# Patient Record
Sex: Female | Born: 1970
Health system: Southern US, Community
[De-identification: ages and names within clinical notes are randomized; demographics above are authoritative.]

## PROBLEM LIST (undated history)

## (undated) ENCOUNTER — Inpatient Hospital Stay (HOSPITAL_COMMUNITY): Payer: Self-pay

## (undated) DIAGNOSIS — T7840XA Allergy, unspecified, initial encounter: Secondary | ICD-10-CM

## (undated) DIAGNOSIS — N809 Endometriosis, unspecified: Secondary | ICD-10-CM

## (undated) DIAGNOSIS — Z9889 Other specified postprocedural states: Secondary | ICD-10-CM

## (undated) DIAGNOSIS — O09529 Supervision of elderly multigravida, unspecified trimester: Secondary | ICD-10-CM

## (undated) DIAGNOSIS — R112 Nausea with vomiting, unspecified: Secondary | ICD-10-CM

## (undated) DIAGNOSIS — C801 Malignant (primary) neoplasm, unspecified: Secondary | ICD-10-CM

## (undated) HISTORY — DX: Allergy, unspecified, initial encounter: T78.40XA

## (undated) HISTORY — PX: APPENDECTOMY: SHX54

## (undated) HISTORY — DX: Malignant (primary) neoplasm, unspecified: C80.1

## (undated) HISTORY — PX: TONSILLECTOMY: SUR1361

## (undated) HISTORY — PX: BREAST REDUCTION SURGERY: SHX8

## (undated) HISTORY — PX: REDUCTION MAMMAPLASTY: SUR839

## (undated) HISTORY — PX: LAPAROSCOPY: SHX197

---

## 2004-08-21 ENCOUNTER — Ambulatory Visit (HOSPITAL_COMMUNITY): Admission: RE | Admit: 2004-08-21 | Discharge: 2004-08-21 | Payer: Self-pay | Admitting: Obstetrics & Gynecology

## 2004-09-14 ENCOUNTER — Ambulatory Visit (HOSPITAL_COMMUNITY): Admission: RE | Admit: 2004-09-14 | Discharge: 2004-09-14 | Payer: Self-pay | Admitting: Obstetrics & Gynecology

## 2004-09-25 ENCOUNTER — Inpatient Hospital Stay (HOSPITAL_COMMUNITY): Admission: AD | Admit: 2004-09-25 | Discharge: 2004-09-25 | Payer: Self-pay | Admitting: Obstetrics

## 2004-12-09 HISTORY — PX: EXCISION MORTON'S NEUROMA: SHX5013

## 2005-05-13 ENCOUNTER — Inpatient Hospital Stay (HOSPITAL_COMMUNITY): Admission: AD | Admit: 2005-05-13 | Discharge: 2005-05-15 | Payer: Self-pay | Admitting: Obstetrics & Gynecology

## 2005-05-16 ENCOUNTER — Encounter: Admission: RE | Admit: 2005-05-16 | Discharge: 2005-06-06 | Payer: Self-pay | Admitting: Obstetrics & Gynecology

## 2005-09-04 ENCOUNTER — Ambulatory Visit: Payer: Self-pay | Admitting: Family Medicine

## 2005-09-10 ENCOUNTER — Ambulatory Visit: Payer: Self-pay | Admitting: Family Medicine

## 2006-08-05 ENCOUNTER — Ambulatory Visit: Payer: Self-pay | Admitting: Family Medicine

## 2007-01-01 ENCOUNTER — Encounter: Admission: RE | Admit: 2007-01-01 | Discharge: 2007-01-01 | Payer: Self-pay | Admitting: Obstetrics & Gynecology

## 2007-07-01 ENCOUNTER — Ambulatory Visit: Payer: Self-pay | Admitting: Family Medicine

## 2007-07-02 LAB — CONVERTED CEMR LAB
HCV Ab: NEGATIVE
Hep A IgM: NEGATIVE
Hep B C IgM: NEGATIVE
Hepatitis B Surface Ag: NEGATIVE

## 2007-07-03 ENCOUNTER — Encounter: Payer: Self-pay | Admitting: Family Medicine

## 2008-01-25 ENCOUNTER — Telehealth: Payer: Self-pay | Admitting: Family Medicine

## 2008-01-26 ENCOUNTER — Ambulatory Visit: Payer: Self-pay | Admitting: Family Medicine

## 2008-01-29 LAB — CONVERTED CEMR LAB: Vit D, 1,25-Dihydroxy: 15 — ABNORMAL LOW (ref 30–89)

## 2008-03-04 ENCOUNTER — Ambulatory Visit (HOSPITAL_COMMUNITY): Admission: RE | Admit: 2008-03-04 | Discharge: 2008-03-04 | Payer: Self-pay | Admitting: Obstetrics & Gynecology

## 2008-05-25 ENCOUNTER — Ambulatory Visit (HOSPITAL_COMMUNITY): Admission: RE | Admit: 2008-05-25 | Discharge: 2008-05-25 | Payer: Self-pay | Admitting: Obstetrics & Gynecology

## 2008-06-28 ENCOUNTER — Ambulatory Visit (HOSPITAL_COMMUNITY): Admission: RE | Admit: 2008-06-28 | Discharge: 2008-06-28 | Payer: Self-pay | Admitting: Obstetrics & Gynecology

## 2008-10-14 ENCOUNTER — Inpatient Hospital Stay (HOSPITAL_COMMUNITY): Admission: RE | Admit: 2008-10-14 | Discharge: 2008-10-17 | Payer: Self-pay | Admitting: Obstetrics & Gynecology

## 2010-01-30 ENCOUNTER — Ambulatory Visit: Payer: Self-pay | Admitting: Family Medicine

## 2010-01-30 DIAGNOSIS — E7800 Pure hypercholesterolemia, unspecified: Secondary | ICD-10-CM

## 2010-01-30 DIAGNOSIS — E559 Vitamin D deficiency, unspecified: Secondary | ICD-10-CM

## 2010-01-31 LAB — CONVERTED CEMR LAB
ALT: 12 units/L (ref 0–35)
AST: 20 units/L (ref 0–37)
Albumin: 4 g/dL (ref 3.5–5.2)
Alkaline Phosphatase: 53 units/L (ref 39–117)
BUN: 13 mg/dL (ref 6–23)
Bilirubin, Direct: 0.1 mg/dL (ref 0.0–0.3)
CO2: 28 meq/L (ref 19–32)
Calcium: 9.3 mg/dL (ref 8.4–10.5)
Chloride: 106 meq/L (ref 96–112)
Cholesterol: 155 mg/dL (ref 0–200)
Creatinine, Ser: 0.6 mg/dL (ref 0.4–1.2)
GFR calc non Af Amer: 118.09 mL/min (ref >60)
Glucose, Bld: 88 mg/dL (ref 70–99)
HDL: 50.2 mg/dL (ref >39.00)
LDL Cholesterol: 88 mg/dL (ref 0–99)
Potassium: 4.2 meq/L (ref 3.5–5.1)
Sodium: 139 meq/L (ref 135–145)
TSH: 1.13 microintl units/mL (ref 0.35–5.50)
Total Bilirubin: 0.5 mg/dL (ref 0.3–1.2)
Total CHOL/HDL Ratio: 3
Total Protein: 7.6 g/dL (ref 6.0–8.3)
Triglycerides: 84 mg/dL (ref 0.0–149.0)
VLDL: 16.8 mg/dL (ref 0.0–40.0)
Vit D, 25-Hydroxy: 29 ng/mL — ABNORMAL LOW (ref 30–89)

## 2010-04-06 ENCOUNTER — Ambulatory Visit: Payer: Self-pay | Admitting: Internal Medicine

## 2010-04-06 DIAGNOSIS — H6980 Other specified disorders of Eustachian tube, unspecified ear: Secondary | ICD-10-CM | POA: Insufficient documentation

## 2010-04-17 ENCOUNTER — Ambulatory Visit (HOSPITAL_COMMUNITY): Admission: RE | Admit: 2010-04-17 | Discharge: 2010-04-17 | Payer: Self-pay | Admitting: Obstetrics & Gynecology

## 2010-05-21 ENCOUNTER — Emergency Department (HOSPITAL_COMMUNITY): Admission: EM | Admit: 2010-05-21 | Discharge: 2010-05-22 | Payer: Self-pay | Admitting: Emergency Medicine

## 2010-07-17 ENCOUNTER — Encounter (INDEPENDENT_AMBULATORY_CARE_PROVIDER_SITE_OTHER): Payer: Self-pay | Admitting: *Deleted

## 2010-07-30 ENCOUNTER — Encounter: Payer: Self-pay | Admitting: Family Medicine

## 2010-07-31 ENCOUNTER — Telehealth: Payer: Self-pay | Admitting: Family Medicine

## 2010-08-20 ENCOUNTER — Ambulatory Visit (HOSPITAL_COMMUNITY): Admission: RE | Admit: 2010-08-20 | Discharge: 2010-08-20 | Payer: Self-pay | Admitting: Obstetrics & Gynecology

## 2011-01-09 NOTE — Assessment & Plan Note (Signed)
Summary: EAR IS STOPPED UP   Vital Signs:  Patient profile:   40 year old female Height:      65.5 inches Weight:      174.25 pounds BMI:     28.66 Temp:     98.1 degrees F oral Pulse rate:   64 / minute Pulse rhythm:   regular BP sitting:   112 / 62  (right arm) Cuff size:   regular  Vitals Entered By: Linde Gillis CMA Duncan Dull) (April 06, 2010 3:03 PM) CC: ears stopped up   History of Present Illness: Left ear "is driving me crazy" Feels full--like it is occluded Using debrox and warm water flushes for 3 days No success  No fever No URI symptoms like sneezing, coughing or rhinorrhea  some allergy problems with chronic congestion no meds though -- "its just the way I am"  Preventive Screening-Counseling & Management  Alcohol-Tobacco     Smoking Status: never  Allergies (verified): No Known Drug Allergies  Past History:  Past Surgical History: Last updated: 06/25/2007 NSVD 05/2005 Appendectomy 1987 Breast Reduction 1989 Laparotomy Endometriosis Tonsillectomy 1991 MRI C-Spine, sm Thyroid nodules, C5/6 mod disc dz 07/03/05  Family History: Last updated: 01/30/2010 Father: A 70   PTCA  Chol Mother: A 31 Brother A 41 CV- + Father HBP- + PGM Prostate Cancer- PGF ? Colon Cancer- + MGM + Stroke PGM  Social History: Last updated: 04/06/2010 Marital Status: Married  Husb had vasectomy and reversal and redo. 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   Head:  no sinus tenderness Ears:  no tragal tenderness TMs appear normal bilat no canal inflammation No  clear fluid Neck:  supple, no masses, and no cervical lymphadenopathy.     Impression & Recommendations:  Problem # 1:  EUSTACHIAN TUBE DYSFUNCTION (ICD-381.81) Assessment New doesn't clear ly seem to have serous OM though not clearly allergic mediated  will try OTC antihistamine and decongestant ENT referral if not improving  Complete Medication List: 1)  Vitamin D 1000 Unit Tabs (Cholecalciferol) .... Take one by mouth daily 2)  Multivitamins Caps (Multiple vitamin) .... Take one by mouth daily 3)  Dexatrim Natural Tabs (Nutritional supp - diet aids) .... As needed  Patient Instructions: 1)  Try cetirizine or loratadine with pseudoephedrine for the next week. 2)  Call if no better in a week or so and we will refer you for ENT evaluation  Current Allergies (reviewed today): No known allergies

## 2011-01-09 NOTE — Letter (Signed)
Summary: Nadara Eaton letter  [place] at [facility]  [address] [place], [place] [address]   Phone: [phone]  Fax: [phone]       07/17/2010 MRN: [id number]  Alynna CATER [address] [place], [place]  [address]  Dear Ms. [facility] [place], and [place] announce the retirement of Arta Silence, M.D., from full-time practice at the [facility] office effective June 07, 2010 and his plans of returning part-time.  It is important to Dr. Hetty Ely and to our practice that you understand that [facility] has seven physicians in our office for your health care needs.  We will continue to offer the same exceptional care that you have today.    Dr. Hetty Ely has spoken to many of you about his plans for retirement and returning part-time in the fall.   We will continue to work with you through the transition to schedule appointments for you in the office and meet the high standards that [facility] is committed to.   Again, it is with great pleasure that we share the news that Dr. Hetty Ely will return to [facility] in October of 2011 with a reduced schedule.    If you have any questions, or would like to request an appointment with one of our physicians, please call us at [phone] and press the option for Scheduling an appointment.  We take pleasure in providing you with excellent patient care and look forward to seeing you at your next office visit.  Our [facility] Physicians are:  Tillman Abide, M.D. Laurita Quint, M.D. Roxy Manns, M.D. Kerby Nora, M.D. Hannah Beat, M.D. Ruthe Mannan, M.D. We proudly welcomed Raechel Ache, M.D. and Eustaquio Boyden, M.D. to the practice in July/August 2011.  Sincerely,  [facility]

## 2011-01-09 NOTE — Progress Notes (Signed)
Summary: paper work for [place] Note Call from Patient Call back at [employer] [phone]   Caller: Patient Call For: Shaune Leeks MD Summary of Call: Patient came in Feb. to have paper work filled out for adoption. Patient is calling today stating that on her form the question that asks if the patient has the emotional and physical stability to parent a child is checked no and she feels that it was done in error. It was also checked no on her husband's form which was not actually scanned into the patient's chart so the wife is going to bring the original copy in. She wants to know if this can be fixed or if they will need to do the visit over again w/ new form. Please advise. Forms are on your desk.   Initial call taken by: Melody Comas,  July 31, 2010 3:18 PM  Follow-up for Phone Call        I agree, this appears to be done in error.  I corrected both forms and marked it with my initals and the date.  Please send this back in.  If they need the forms done again from scratch, please let me know.  Follow-up by: Crawford Givens MD,  July 31, 2010 4:56 PM  Additional Follow-up for Phone Call Additional follow up Details #1::        Spoke w/ patient and she says that she thinks it would be better if you signed your whole name instead of just initials. Forms are on your desk.  Additional Follow-up by: Melody Comas,  August 01, 2010 9:13 AM    Additional Follow-up for Phone Call Additional follow up Details #2::    done,  thanks.  In my out box.  Follow-up by: Crawford Givens MD,  August 01, 2010 1:33 PM  Additional Follow-up for Phone Call Additional follow up Details #3:: Details for Additional Follow-up Action Taken: Called patient, [facility] for her to return my call. Adoption paper work left up front for pick up.  Melody Comas  August 01, 2010 4:22 PM  Spoke with patient, she will come by and pick up letters.  Additional Follow-up by: Melody Comas,  August 02, 2010 9:06 AM

## 2011-01-09 NOTE — Assessment & Plan Note (Signed)
Summary: discuss paper work/ alc   Vital Signs:  Patient profile:   40 year old female Height:      65.5 inches Weight:      177 pounds BMI:     29.11 Temp:     98.5 degrees F oral Pulse rate:   60 / minute Pulse rhythm:   regular BP sitting:   100 / 60  (left arm) Cuff size:   regular  Vitals Entered By: Sydell Axon LPN (January 30, 2010 3:34 PM) CC: Complete paperwork for adoption   History of Present Illness: Pt here for paperwork for adoption. She has had baby in the meantime, has 29 month old daughter. They had done this 3 yrs ago.  She has no complaints. She weighs more than she would like.  She has been on Vit D 1000Iu daily. HAd been on 50000Iu weekly a few years ago and was lost to followup.  Preventive Screening-Counseling & Management  Alcohol-Tobacco     Alcohol drinks/day: 0     Smoking Status: never     Passive Smoke Exposure: no  Caffeine-Diet-Exercise     Caffeine use/day: 1 soda     Does Patient Exercise: no  Problems Prior to Update: 1)  Vitamin D Deficiency  (ICD-268.9) 2)  Familial Hypercholesterolemia  (ICD-272.0) 3)  Health Maintenance Exam  (ICD-V70.0)  Medications Prior to Update: 1)  Vitamin D 16109 Unit  Caps (Ergocalciferol) .... One Tab By Mouth Once Every Other Week For Eight Weeks (Four Doses)  Allergies: No Known Drug Allergies  Past History:  Past Surgical History: Last updated: 06/25/2007 NSVD 05/2005 Appendectomy 1987 Breast Reduction 1989 Laparotomy Endometriosis Tonsillectomy 1991 MRI C-Spine, sm Thyroid nodules, C5/6 mod disc dz 07/03/05  Family History: Last updated: 01/30/2010 Father: A 70   PTCA  Chol Mother: A 19 Brother A 41 CV- + Father HBP- + PGM Prostate Cancer- PGF ? Colon Cancer- + MGM + Stroke PGM  Social History: Last updated: 01/30/2010 Marital Status: Married  Husb had vasectomy and reversal and redo. Children: One son  58/2, one daughter 43mo Occupation: Housewife  Risk Factors: Alcohol  Use: 0 (01/30/2010) Caffeine Use: 1 soda (01/30/2010) Exercise: no (01/30/2010)  Risk Factors: Smoking Status: never (01/30/2010) Passive Smoke Exposure: no (01/30/2010)  Family History: Father: A 70   PTCA  Chol Mother: A 44 Brother A 41 CV- + Father HBP- + PGM Prostate Cancer- PGF ? Colon Cancer- + MGM + Stroke PGM  Social History: Marital Status: Married  Husb had vasectomy and reversal and redo. Children: One son  45/2, one daughter 43mo Occupation: Housewife Caffeine use/day:  1 soda  Review of Systems General:  Denies chills, fatigue, fever, loss of appetite, malaise, sleep disorder, sweats, weakness, and weight loss. Eyes:  Denies blurring, discharge, and eye pain; periodic eye problems w/ infections, etc last 5 years.. ENT:  Denies decreased hearing, ear discharge, earache, and ringing in ears. CV:  Denies chest pain or discomfort, fainting, fatigue, palpitations, shortness of breath with exertion, and swelling of feet. Resp:  Denies cough, shortness of breath, and wheezing. GI:  Denies abdominal pain, bloody stools, change in bowel habits, constipation, dark tarry stools, diarrhea, indigestion, loss of appetite, nausea, vomiting, vomiting blood, and yellowish skin color. GU:  Denies discharge, dysuria, nocturia, and urinary frequency. MS:  Complains of mid back pain and stiffness; denies joint pain, low back pain, muscle aches, cramps, and muscle weakness; global. Derm:  Denies changes in color of skin, changes in nail beds,  dryness, excessive perspiration, flushing, hair loss, insect bite(s), itching, lesion(s), poor wound healing, and rash. Neuro:  Denies memory loss, numbness, poor balance, tingling, and tremors.  Physical Exam  General:  Well-developed,well-nourished,in no acute distress; alert,appropriate and cooperative throughout examination Head:  Normocephalic and atraumatic without obvious abnormalities. No apparent alopecia or balding. Sinuses NT. Eyes:   Conjunctiva clear bilaterally.  Ears:  External ear exam shows no significant lesions or deformities.  Otoscopic examination reveals clear canals, tympanic membranes are intact bilaterally without bulging, retraction, inflammation or discharge. Hearing is grossly normal bilaterally. Nose:  External nasal examination shows no deformity or inflammation. Nasal mucosa are pink and moist without lesions or exudates. Mouth:  Oral mucosa and oropharynx without lesions or exudates.  Teeth in good repair. Neck:  No deformities, masses, or tenderness noted. Chest Wall:  No deformities, masses, or tenderness noted. Breasts:  not done Lungs:  Normal respiratory effort, chest expands symmetrically. Lungs are clear to auscultation, no crackles or wheezes. Heart:  Normal rate and regular rhythm. S1 and S2 normal without gallop, murmur, click, rub or other extra sounds. Abdomen:  Bowel sounds positive,abdomen soft and non-tender without masses, organomegaly or hernias noted. Rectal:  not done Genitalia:  not done Msk:  No deformity or scoliosis noted of thoracic or lumbar spine.   Pulses:  R and L carotid,radial,femoral,dorsalis pedis and posterior tibial pulses are full and equal bilaterally Extremities:  No clubbing, cyanosis, edema, or deformity noted with normal full range of motion of all joints.   Neurologic:  No cranial nerve deficits noted. Station and gait are normal. Sensory, motor and coordinative functions appear intact. Skin:  Intact without suspicious lesions or rashes Cervical Nodes:  No lymphadenopathy noted Inguinal Nodes:  No significant adenopathy Psych:  Cognition and judgment appear intact. Alert and cooperative with normal attention span and concentration. No apparent delusions, illusions, hallucinations   Impression & Recommendations:  Problem # 1:  HEALTH MAINTENANCE EXAM (ICD-V70.0) PPd placed today. Orders: TLB-BMP (Basic Metabolic Panel-BMET) (80048-METABOL)  Problem # 2:   FAMILIAL HYPERCHOLESTEROLEMIA (ICD-272.0) Assessment: Unchanged Will recheck today. Orders: TLB-Lipid Panel (80061-LIPID) TLB-Hepatic/Liver Function Pnl (80076-HEPATIC) TLB-TSH (Thyroid Stimulating Hormone) (84443-TSH)  Problem # 3:  VITAMIN D DEFICIENCY (ICD-268.9) Assessment: Unchanged Will recheck today. Is basically on 1000Iu daily. Orders: T-Vitamin D (25-Hydroxy) [phone]) Specimen Handling (91478)  Complete Medication List: 1)  Vitamin D 1000 Unit Tabs (Cholecalciferol) .... Take one by mouth daily 2)  Multivitamins Caps (Multiple vitamin) .... Take one by mouth daily 3)  Dexatrim Natural Tabs (Nutritional supp - diet aids) .... As needed  Other Orders: TB Skin Test [phone]) Admin 1st Vaccine (13086) Admin 1st Vaccine [facility]) [phone])  Patient Instructions: 1)  Form filled out. PPD today, RTC Thu. Report labs then when PPD read.  Current Allergies (reviewed today): No known allergies    PPD Application    Vaccine Type: PPD    Site: left forearm    Mfr: Sanofi Pasteur    Dose: 0.1 ml    Route: ID    Given by: Sydell Axon LPN    Exp. Date: 05/05/2012    Lot #: [id number]   Appended Document: discuss paper work/ alc TB skin test read today, negative.  Form dated and signed, original given to patient and a copy was sent to be scanned into chart.

## 2011-01-09 NOTE — Letter (Signed)
Summary: Corrected [facility],Request for Medical Informat  [facility]rected [facility] ,Request for Medical Information   Imported By: Beau Fanny 08/01/2010 14:20:43  _____________________________________________________________________  External Attachment:    Type:   Image     Comment:   External Document

## 2011-01-09 NOTE — Letter (Signed)
Summary: [facility] [place] Request for Medical Information Form  [facility] Request for Medical Information Form   Imported By: Beau Fanny 02/01/2010 13:43:12  _____________________________________________________________________  External Attachment:    Type:   Image     Comment:   External Document

## 2011-02-25 LAB — DIFFERENTIAL
Basophils Absolute: 0.2 10*3/uL — ABNORMAL HIGH (ref 0.0–0.1)
Basophils Relative: 2 % — ABNORMAL HIGH (ref 0–1)
Eosinophils Absolute: 0.1 10*3/uL (ref 0.0–0.7)
Eosinophils Relative: 1 % (ref 0–5)
Lymphocytes Relative: 31 % (ref 12–46)
Lymphs Abs: 3.5 10*3/uL (ref 0.7–4.0)
Monocytes Absolute: 1 10*3/uL (ref 0.1–1.0)
Monocytes Relative: 9 % (ref 3–12)
Neutro Abs: 6.4 10*3/uL (ref 1.7–7.7)
Neutrophils Relative %: 57 % (ref 43–77)

## 2011-02-25 LAB — CBC
HCT: 39.6 % (ref 36.0–46.0)
Hemoglobin: 13.4 g/dL (ref 12.0–15.0)
MCHC: 33.9 g/dL (ref 30.0–36.0)
MCV: 90.3 fL (ref 78.0–100.0)
RBC: 4.39 MIL/uL (ref 3.87–5.11)
RDW: 13.9 % (ref 11.5–15.5)
WBC: 11.2 10*3/uL — ABNORMAL HIGH (ref 4.0–10.5)

## 2011-02-25 LAB — POCT CARDIAC MARKERS
CKMB, poc: <1 ng/mL — ABNORMAL LOW (ref 1.0–8.0)
CKMB, poc: <1 ng/mL — ABNORMAL LOW (ref 1.0–8.0)
Myoglobin, poc: 34.6 ng/mL (ref 12–200)
Myoglobin, poc: 49.3 ng/mL (ref 12–200)
Troponin i, poc: <0.05 ng/mL (ref 0.00–0.09)
Troponin i, poc: <0.05 ng/mL (ref 0.00–0.09)

## 2011-02-25 LAB — POCT I-STAT, CHEM 8
BUN: 14 mg/dL (ref 6–23)
Calcium, Ion: 1.13 mmol/L (ref 1.12–1.32)
Chloride: 107 mEq/L (ref 96–112)
Creatinine, Ser: 0.6 mg/dL (ref 0.4–1.2)
Glucose, Bld: 88 mg/dL (ref 70–99)
HCT: 41 % (ref 36.0–46.0)
Hemoglobin: 13.9 g/dL (ref 12.0–15.0)
Potassium: 3.7 mEq/L (ref 3.5–5.1)
Sodium: 141 mEq/L (ref 135–145)
TCO2: 25 mmol/L (ref 0–100)

## 2011-02-25 LAB — D-DIMER, QUANTITATIVE: D-Dimer, Quant: 0.51 ug/mL-FEU — ABNORMAL HIGH (ref 0.00–0.48)

## 2011-04-23 NOTE — H&P (Signed)
Patricia Thornton, Patricia Thornton                  ACCOUNT NO.:  [id number]   MEDICAL RECORD NO.:  [id number]          PATIENT TYPE:  INP   LOCATION:  9171                          [facility]ILITY:  [facility]   PHYSICIAN:  Roseanna Rainbow, M.D.DATE OF BIRTH:  [birth year]   DATE OF ADMISSION:  10/14/2008  DATE OF DISCHARGE:                              HISTORY & PHYSICAL   CHIEF COMPLAINT:  The patient is a 40 year old para 1 with an estimated  date of confinement of October 23, 2008, with an intrauterine pregnancy  at 39 weeks complaining of rupture of membranes.   HISTORY OF PRESENT ILLNESS:  The patient reports several gushes of clear  fluid several hours prior to presentation.  She denies any regular  contractions.  She reported good fetal movement until this morning.   ALLERGIES:  No known drug allergies.   MEDICATIONS:  Please see the medication reconciliation form.   OBSTETRICAL RISK FACTORS:  Advanced maternal age.   PAST OBSTETRICAL HISTORY:  There is a history of a first trimester  spontaneous abortion.  In June 2006, she has delivered of a live born  female 8 pounds 6 ounces vaginal delivery of full term, no complications.   PRENATAL LABORATORY:  Blood type is O+, antibody screen negative.  Urine  culture and sensitivity, no urine pathogens.  One-hour GCT 93.  GBS on  September 22, 2008, negative.  Hepatitis B surface antigen negative,  hematocrit 36.1, hemoglobin 12, HIV nonreactive.  Platelet count  300,000, RPR nonreactive, rubella immune.   An ultrasound on May 25, 2008, at 18 weeks 3 days, no previa, normal  amniotic fluid index.   PAST GYN HISTORY:  Endometriosis.  She is status post operative  laparoscopy.   PAST MEDICAL HISTORY:  Migraine headaches, tension headaches.   PAST SURGICAL HISTORY:  Please see the above.  Bilateral breast  reduction, tonsils, and adenoids, appendectomy.   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  She denies any tobacco,  ethanol, or drug use.   FAMILY HISTORY:  Adult-onset diabetes, hypertension.   PHYSICAL EXAMINATION:  VITAL SIGNS:  Stable, afebrile.  Fetal heart rate  by Doppler 140.  GENERAL:  Well-developed, well-nourished Caucasian female in no apparent  distress.  ABDOMEN:  Gravid, nontender.  Sterile speculum exam, gross pooling clear  fluid.  The cervix was not well visualized.   ASSESSMENT:  Primipara at term, SROM for clear fluid.  GBS negative.   PLAN:  Admission, expectant management for now.  Possible augmentation  of labor with low-dose Pitocin per protocol.      Roseanna Rainbow, M.D.  Electronically Signed     LAJ/MEDQ  D:  10/14/2008  T:  10/15/2008  Job:  [id number]

## 2011-04-26 NOTE — H&P (Signed)
Patricia Thornton, Patricia Thornton                  ACCOUNT NO.:  [id number]   MEDICAL RECORD NO.:  [id number]          PATIENT TYPE:  INP   LOCATION:  9165                          [facility]   PHYSICIAN:  Roseanna Rainbow, M.D.DATE OF BIRTH:  09-30-1971   DATE OF ADMISSION:  05/13/2005  DATE OF DISCHARGE:                                HISTORY & PHYSICAL   CHIEF COMPLAINT:  The patient is a 40 year old para 0 with an estimated date  of confinement of May 15, 2005 with an intrauterine pregnancy at 39-5/7  weeks complaining of uterine contractions.   HISTORY OF PRESENT ILLNESS:  Please see the above.  She denies rupture of  membranes.  She reports good fetal movement.  Antepartum course, prenatal  care with [facility].  Onset of care first trimester.  Problems/risks urinary  tract infection.  Prenatal screens:  Hemoglobin 14.1, platelets 327,000.  Blood type O+.  Antibody screen negative.  RPR nonreactive.  Hepatitis B  surface antigen negative.  HIV nonreactive.  Urine culture and sensitivity  E. coli.  Diabetes screen normal.  Ultrasound on March 31 at 30 weeks 2 days  normal amniotic fluid volume, appropriate interval growth, estimated fetal  weight percentile 56th percentile.  GBS is negative.   PAST OB/GYN HISTORY:  Endometriosis.  She is status post laparoscopic  surgery for endometriosis.   PAST MEDICAL HISTORY:  1.  Irritable bowel syndrome.  2.  Urinary tract infection.   PAST SURGICAL HISTORY:  Please see the above.  She is also status post an  appendectomy, a tonsillectomy, and breast reduction.   FAMILY HISTORY:  Heart disease.   SOCIAL HISTORY:  She is married.  She is employed as a Systems analyst.  She denies any tobacco, ethanol, or substance abuse.   MEDICATIONS:  Prenatal vitamins.   ALLERGIES:  No known drug allergies.   PHYSICAL EXAMINATION:  VITAL SIGNS:  Stable, afebrile.  Fetal heart tracing  reassuring.  Tocodynamometer uterine contractions every 2-5  minutes.  GENERAL:  Uncomfortable, but coping well with uterine contractions.  PELVIC:  Sterile vaginal examination is per the R.N.  7 cm dilated, 100%  effaced with a bulging bag of water.   ASSESSMENT:  1.  Primigravida with an intrauterine pregnancy at term.  2.  Active labor.  3.  Fetal heart tracing consistent with fetal well being.   PLAN:  Admission.  Expected management.  Anticipate a normal spontaneous  vaginal delivery.       LAJ/MEDQ  D:  05/13/2005  T:  05/13/2005  Job:  [id number]

## 2011-08-20 ENCOUNTER — Other Ambulatory Visit: Payer: Self-pay | Admitting: Obstetrics & Gynecology

## 2011-08-20 DIAGNOSIS — Z1231 Encounter for screening mammogram for malignant neoplasm of breast: Secondary | ICD-10-CM

## 2011-09-10 LAB — BLOOD GAS, ARTERIAL
Acid-base deficit: 6.2 — ABNORMAL HIGH
Bicarbonate: 16.5 — ABNORMAL LOW
O2 Saturation: 99
TCO2: 17.3
pO2, Arterial: 247 — ABNORMAL HIGH

## 2011-09-10 LAB — CBC
HCT: 34.2 — ABNORMAL LOW
HCT: 38.1
Hemoglobin: 11.2 — ABNORMAL LOW
Hemoglobin: 12.4
Hemoglobin: 13.7
MCHC: 32.4
MCHC: 32.6
MCV: 89.8
MCV: 90.4
Platelets: 270
Platelets: 292
RBC: 4.24
RDW: 13.3
RDW: 13.4
RDW: 13.5
WBC: 11.2 — ABNORMAL HIGH
WBC: 11.9 — ABNORMAL HIGH

## 2011-09-10 LAB — COMPREHENSIVE METABOLIC PANEL
ALT: 11
Albumin: 2.8 — ABNORMAL LOW
Alkaline Phosphatase: 143 — ABNORMAL HIGH
Chloride: 107
Potassium: 3.7
Sodium: 134 — ABNORMAL LOW
Total Bilirubin: 0.6
Total Protein: 6.6

## 2011-09-10 LAB — RPR: RPR Ser Ql: NONREACTIVE

## 2011-09-12 ENCOUNTER — Ambulatory Visit (HOSPITAL_COMMUNITY)
Admission: RE | Admit: 2011-09-12 | Discharge: 2011-09-12 | Disposition: A | Discharge status: Home / Self Care | Payer: BC Managed Care – PPO | Source: Ambulatory Visit | Attending: Obstetrics & Gynecology | Admitting: Obstetrics & Gynecology

## 2011-09-12 DIAGNOSIS — Z1231 Encounter for screening mammogram for malignant neoplasm of breast: Secondary | ICD-10-CM

## 2011-10-01 ENCOUNTER — Other Ambulatory Visit: Payer: Self-pay | Admitting: Obstetrics & Gynecology

## 2011-10-01 DIAGNOSIS — Z1231 Encounter for screening mammogram for malignant neoplasm of breast: Secondary | ICD-10-CM

## 2011-10-02 ENCOUNTER — Ambulatory Visit
Admission: RE | Admit: 2011-10-02 | Discharge: 2011-10-02 | Disposition: A | Discharge status: Home / Self Care | Payer: BC Managed Care – PPO | Source: Ambulatory Visit | Attending: Obstetrics & Gynecology | Admitting: Obstetrics & Gynecology

## 2011-10-02 DIAGNOSIS — Z1231 Encounter for screening mammogram for malignant neoplasm of breast: Secondary | ICD-10-CM

## 2011-12-13 ENCOUNTER — Telehealth: Payer: Self-pay | Admitting: Family Medicine

## 2011-12-13 NOTE — Telephone Encounter (Signed)
I don't see a date for tdap for her.  According to notes from 2006, Dr. Hetty Ely was going to check on tdap date in old records, but I still don't see a date that it was given.

## 2011-12-13 NOTE — Telephone Encounter (Signed)
Patient advised.

## 2012-05-01 ENCOUNTER — Telehealth: Payer: Self-pay | Admitting: Family Medicine

## 2012-05-01 NOTE — Telephone Encounter (Signed)
Caller: Patricia Thornton/Patient; PCP: Laurita Quint (retired); CB#: [phone]; Calling today 05/01/12 regarding she has a sore on her right leg.  Had a bug bite.  Onset 04/21/12 when she went camping.  Has several other bites and they look ok.  This bite is about 1/2 across and dark in the center.  Not having any drainage.  Has a yellowish color with red rim.  Afebrile.  Emergent symptoms r/o by Skin Lesions guidelines with exception of any skin lesion with signs and symptoms of worsening infection.  Offered appt for today with Dr. Para March, however pt declined appt saying she is leaving to go out of town now and will just stop by an urgent care on the way.  Advised pt will let office know.

## 2012-05-01 NOTE — Telephone Encounter (Signed)
Noted  

## 2012-07-14 LAB — OB RESULTS CONSOLE ABO/RH: RH Type: POSITIVE

## 2012-07-14 LAB — OB RESULTS CONSOLE HEPATITIS B SURFACE ANTIGEN: Hepatitis B Surface Ag: NEGATIVE

## 2012-07-14 LAB — OB RESULTS CONSOLE ANTIBODY SCREEN: Antibody Screen: NEGATIVE

## 2012-07-15 ENCOUNTER — Encounter (HOSPITAL_COMMUNITY): Payer: Self-pay | Admitting: Obstetrics & Gynecology

## 2012-07-15 ENCOUNTER — Other Ambulatory Visit: Payer: Self-pay | Admitting: Obstetrics

## 2012-07-15 DIAGNOSIS — Z369 Encounter for antenatal screening, unspecified: Secondary | ICD-10-CM

## 2012-07-16 ENCOUNTER — Encounter (HOSPITAL_COMMUNITY): Payer: Self-pay | Admitting: Obstetrics & Gynecology

## 2012-08-04 ENCOUNTER — Encounter (HOSPITAL_COMMUNITY): Payer: Self-pay

## 2012-08-04 ENCOUNTER — Other Ambulatory Visit: Payer: Self-pay

## 2012-08-04 ENCOUNTER — Ambulatory Visit (HOSPITAL_COMMUNITY)
Admission: RE | Admit: 2012-08-04 | Discharge: 2012-08-04 | Disposition: A | Discharge status: Home / Self Care | Payer: BC Managed Care – PPO | Source: Ambulatory Visit | Attending: Obstetrics | Admitting: Obstetrics

## 2012-08-04 DIAGNOSIS — Z369 Encounter for antenatal screening, unspecified: Secondary | ICD-10-CM

## 2012-08-04 DIAGNOSIS — IMO0002 Reserved for concepts with insufficient information to code with codable children: Secondary | ICD-10-CM

## 2012-08-04 DIAGNOSIS — O2620 Pregnancy care for patient with recurrent pregnancy loss, unspecified trimester: Secondary | ICD-10-CM | POA: Insufficient documentation

## 2012-08-04 DIAGNOSIS — O09529 Supervision of elderly multigravida, unspecified trimester: Secondary | ICD-10-CM | POA: Insufficient documentation

## 2012-08-04 HISTORY — DX: Supervision of elderly multigravida, unspecified trimester: O09.529

## 2012-08-04 HISTORY — DX: Endometriosis, unspecified: N80.9

## 2012-08-04 NOTE — Progress Notes (Signed)
[name]  was seen today for an ultrasound appointment.  See full report in [facility].  Alpha Gula, MD  Single IUP at 12 6/7 weeks NT of 1.6 mm noted; Nasal bone visualized First trimester screen performed as above  Recommend follow up ultrasound in 6 weeks for detailed anatomy

## 2012-08-06 NOTE — Progress Notes (Signed)
Genetic Counseling  High-Risk Gestation Note  Appointment Date:  08/04/2012 Referred By: Brock Bad, MD Date of Birth:  1971-11-18     Pregnancy History: [id number] Estimated Date of Delivery: 02/10/13 Estimated Gestational Age: [redacted]w[redacted]d Attending: Alpha Gula, MD   [name] was seen for genetic counseling because of a maternal age of 41 y.o.Marland Kitchen     The patient reported that the current pregnancy was conceived with an adopted embryo (donor ovum and donor sperm). She reported that the age of the egg donor was 42 years old. She was counseled regarding maternal age and the association with risk for chromosome conditions due to nondisjunction with aging of the ova.   We reviewed chromosomes and nondisjunction. She was counseled that the risk for aneuploidy decreases as gestational age increases, accounting for those pregnancies which spontaneously abort.  We specifically discussed Down syndrome (trisomy 64), trisomies 34 and 58, and sex chromosome aneuploidies (47,XXX and 47,XXY) including the common features and prognoses of each. However, we discussed that given the age of the egg donor (41 y.o.), the current pregnancy is not considered to be at increased risk for aneuploidy.   We reviewed available screening options including First screen, noninvasive prenatal testing (NIPT), and detailed ultrasound. She understands that screening tests are used to modify a patient's a priori risk for aneuploidy, typically based on age (in this case age of the egg donor).  This estimate provides a pregnancy specific risk assessment.  Specifically, we discussed that NIPT analyzes cell free fetal DNA found in the maternal circulation. This test is not diagnostic for chromosome conditions, but can provide information regarding the presence or absence of extra fetal DNA for chromosomes 13, 18, 21, X, and Y, and missing fetal DNA for chromosome X and Y (Turner syndrome). Thus, it would not identify or rule out  all genetic conditions. The reported detection rate is greater than 99% for Trisomy 21, greater than 98% for Trisomy 18, and is approximately 80% (8 out of 10) for Trisomy 13. The false positive rate is reported to be less than 0.1% for any of these conditions.  In addition, we discussed that ~50-80% of fetuses with Down syndrome and up to 90-95% of fetuses with trisomy 18/13, when well visualized, have detectable anomalies or soft markers by detailed ultrasound (~18+ weeks gestation).   She was also counseled regarding diagnostic testing via CVS or amniocentesis.  We reviewed the approximate 1 in 100 risk for complications for CVS and approximate 1 in 300-500 risk for complications for amniocentesis, including spontaneous pregnancy loss. We discussed the risks, limitations, and benefits of each screening and testing option.   After consideration of all the options, she elected to proceed with first trimester screening today and declined NIPT, CVS, and amniocentesis given that the pregnancy is not considered to have an increased risk for fetal aneuploidy based only on age of egg donor. First trimester screening results will be available in 8-10 days.  A complete ultrasound was performed today.  The ultrasound report will be sent under separate cover. She understands that ultrasound cannot rule out all birth defects or genetic syndromes.   The patient did not have detailed information regarding the family histories of the egg donor or sperm donor. She reported that there was no reported history family history regarding birth defects or known genetic conditions. Caucasian ancestry was reported for both family histories. Without further information regarding the provided family history, an accurate genetic risk cannot be calculated. Further genetic  counseling is warranted if more information is obtained.  [name] denied exposure to environmental toxins or chemical agents. She denied the use of alcohol,  tobacco or street drugs. She denied significant viral illnesses during the course of her pregnancy. Her medical and surgical histories were noncontributory.   I counseled [name] regarding the above risks and available options.  The approximate face-to-face time with the genetic counselor was 35 minutes.  Quinn Plowman, MS,  Certified Genetic Counselor  08/06/2012

## 2012-08-11 ENCOUNTER — Telehealth (HOSPITAL_COMMUNITY): Payer: Self-pay | Admitting: MS"

## 2012-08-11 NOTE — Telephone Encounter (Signed)
Left message for patient to return call.   Patricia Thornton 08/11/2012 11:18 AM

## 2012-08-12 ENCOUNTER — Telehealth (HOSPITAL_COMMUNITY): Payer: Self-pay | Admitting: MS"

## 2012-08-12 NOTE — Telephone Encounter (Signed)
Called [name] to discuss results of her first trimester screen. We discussed that these are within normal range, indicating less than 1 in 10,000 risk for down syndrome, trisomy 18 and trisomy 13. The patient understands that this screening does not diagnose or rule out these conditions, nor does it screen for all chromosome conditions. Patricia Thornton asked about NIPT [facility]) to confirm that this testing can be done at any point in pregnancy. Discussed that this test is available to her and there is not gestational cutoff. She stated that they would not likely pursue [facility] testing given the first trimester screening results. The patient inquired about her 18 week anatomy scan. Discussed that no follow-up if planned here, so the plan may be for this to be performed by her OB. However, we would be happy to perform here, alternatively, if the patient and her OB prefer. She was encouraged to call with additional questions or concerns.   Clydie Braun Daivon Rayos  08/12/2012 9:11 AM

## 2012-09-16 ENCOUNTER — Encounter (HOSPITAL_COMMUNITY): Payer: Self-pay

## 2012-09-16 ENCOUNTER — Ambulatory Visit (HOSPITAL_COMMUNITY)
Admission: RE | Admit: 2012-09-16 | Discharge: 2012-09-16 | Disposition: A | Discharge status: Home / Self Care | Payer: BC Managed Care – PPO | Source: Ambulatory Visit | Attending: Obstetrics | Admitting: Obstetrics

## 2012-09-16 DIAGNOSIS — IMO0002 Reserved for concepts with insufficient information to code with codable children: Secondary | ICD-10-CM

## 2012-09-16 DIAGNOSIS — O2620 Pregnancy care for patient with recurrent pregnancy loss, unspecified trimester: Secondary | ICD-10-CM | POA: Insufficient documentation

## 2012-09-16 DIAGNOSIS — Z3689 Encounter for other specified antenatal screening: Secondary | ICD-10-CM | POA: Insufficient documentation

## 2012-09-16 DIAGNOSIS — O09529 Supervision of elderly multigravida, unspecified trimester: Secondary | ICD-10-CM | POA: Insufficient documentation

## 2012-09-16 NOTE — Progress Notes (Signed)
Patricia Thornton  was seen today for an ultrasound appointment.  See full report in [facility].  Alpha Gula, MD  Single IUP at 19 0/7 weeks Normal detailed fetal anatomy No markers associated with aneuploidy noted Normal amniotic fluid volume  Recommend follow up ultrasouds as clinically indicated

## 2012-10-14 ENCOUNTER — Encounter (HOSPITAL_COMMUNITY): Payer: Self-pay | Admitting: *Deleted

## 2012-10-14 ENCOUNTER — Inpatient Hospital Stay (HOSPITAL_COMMUNITY)
Admission: AD | Admit: 2012-10-14 | Discharge: 2012-10-14 | Disposition: A | Discharge status: Home / Self Care | Payer: BC Managed Care – PPO | Source: Ambulatory Visit | Attending: Obstetrics | Admitting: Obstetrics

## 2012-10-14 DIAGNOSIS — R109 Unspecified abdominal pain: Secondary | ICD-10-CM | POA: Insufficient documentation

## 2012-10-14 DIAGNOSIS — O4700 False labor before 37 completed weeks of gestation, unspecified trimester: Secondary | ICD-10-CM | POA: Insufficient documentation

## 2012-10-14 HISTORY — DX: Other specified postprocedural states: Z98.890

## 2012-10-14 HISTORY — DX: Other specified postprocedural states: R11.2

## 2012-10-14 LAB — URINALYSIS, ROUTINE W REFLEX MICROSCOPIC
Bilirubin Urine: NEGATIVE
Hgb urine dipstick: NEGATIVE
Ketones, ur: NEGATIVE mg/dL
Specific Gravity, Urine: >1.03 — ABNORMAL HIGH (ref 1.005–1.030)
Urobilinogen, UA: 0.2 mg/dL (ref 0.0–1.0)

## 2012-10-14 NOTE — MAU Note (Signed)
Pt reports intermittent cramping since 2am.

## 2012-10-14 NOTE — MAU Provider Note (Signed)
History     CSN: [id number]  Arrival date and time: 10/14/12 4098   First Provider Initiated Contact with Patient 10/14/12 [phone]      Chief Complaint  Patient presents with  . Abdominal Cramping   HPI Patricia Thornton 41 y.o. [redacted]w[redacted]d   Has been up several times tonight at home feeling abdominal pain and feeling the uterus tighten.  Was worried as this is how labor has felt to her in the past.  No contractions felt in the last hour.  OB History    Grav Para Term Preterm Abortions TAB SAB Ect Mult Living   6 2 2  0 3 0 3 0 0 2      Past Medical History  Diagnosis Date  . AMA (advanced maternal age) multigravida 35+   . Endometriosis   . PONV (postoperative nausea and vomiting)     Past Surgical History  Procedure Date  . Laparoscopy   . Appendectomy   . Tonsillectomy   . Breast reduction surgery     Family History  Problem Relation Age of Onset  . Other Neg Hx     History  Substance Use Topics  . Smoking status: Not on file  . Smokeless tobacco: Never Used  . Alcohol Use: No    Allergies: No Known Allergies  Prescriptions prior to admission  Medication Sig Dispense Refill  . miconazole (MONISTAT-3) KIT Place vaginally at bedtime.      . Prenatal Vit w/Fe-Methylfol-FA (PNV PO) Take by mouth.      . FIRST-PROGESTERONE VGS 400 400 MG SUPP         Review of Systems  Constitutional: Negative for fever.  Gastrointestinal: Positive for abdominal pain. Negative for nausea, vomiting, diarrhea and constipation.  Genitourinary:       No vaginal discharge. No vaginal bleeding. No dysuria.   Physical Exam   Blood pressure 119/63, temperature 98.6 F (37 C), temperature source Oral, resp. rate 18, last menstrual period 05/06/2012.  Physical Exam  Nursing note and vitals reviewed. Constitutional: She is oriented to person, place, and time. She appears well-developed and well-nourished.  HENT:  Head: Normocephalic.  Eyes: EOM are normal.  Neck: Neck supple.  GI:  Soft. There is no tenderness.       No contractions on the monitor. Feeling the baby move. No decelerations.  Genitourinary:       Cervical exam - closed, thick, firm, no presenting part in the pelvis, no bleeding.  Musculoskeletal: Normal range of motion.  Neurological: She is alert and oriented to person, place, and time.  Skin: Skin is warm and dry.  Psychiatric: She has a normal mood and affect.    MAU Course  Procedures  MDM Results for orders placed during the hospital encounter of 10/14/12 (from the past 24 hour(s))  URINALYSIS, ROUTINE W REFLEX MICROSCOPIC     Status: Abnormal   Collection Time   10/14/12  4:25 AM      Component Value Range   Color, Urine YELLOW  YELLOW   APPearance CLEAR  CLEAR   Specific Gravity, Urine >1.030 (*) 1.005 - 1.030   pH 6.0  5.0 - 8.0   Glucose, UA NEGATIVE  NEGATIVE mg/dL   Hgb urine dipstick NEGATIVE  NEGATIVE   Bilirubin Urine NEGATIVE  NEGATIVE   Ketones, ur NEGATIVE  NEGATIVE mg/dL   Protein, ur NEGATIVE  NEGATIVE mg/dL   Urobilinogen, UA 0.2  0.0 - 1.0 mg/dL   Nitrite NEGATIVE  NEGATIVE  Leukocytes, UA NEGATIVE  NEGATIVE     Assessment and Plan  Threatened preterm labor  Plan Drink at least 8 8-oz glasses of water every day. Call the office if you are continuing to have contractions - none while in MAU and cervix is closed.  Patricia Thornton 10/14/2012, 5:20 AM

## 2012-11-18 ENCOUNTER — Other Ambulatory Visit: Payer: Self-pay | Admitting: Obstetrics & Gynecology

## 2012-11-18 DIAGNOSIS — O0990 Supervision of high risk pregnancy, unspecified, unspecified trimester: Secondary | ICD-10-CM

## 2012-11-26 ENCOUNTER — Inpatient Hospital Stay (HOSPITAL_COMMUNITY)
Admission: AD | Admit: 2012-11-26 | Discharge: 2012-11-27 | Disposition: A | Discharge status: Home / Self Care | Payer: BC Managed Care – PPO | Source: Ambulatory Visit | Attending: Obstetrics | Admitting: Obstetrics

## 2012-11-26 ENCOUNTER — Encounter (HOSPITAL_COMMUNITY): Payer: Self-pay | Admitting: *Deleted

## 2012-11-26 DIAGNOSIS — W101XXA Fall (on)(from) sidewalk curb, initial encounter: Secondary | ICD-10-CM | POA: Insufficient documentation

## 2012-11-26 DIAGNOSIS — O99891 Other specified diseases and conditions complicating pregnancy: Secondary | ICD-10-CM

## 2012-11-26 DIAGNOSIS — O4700 False labor before 37 completed weeks of gestation, unspecified trimester: Secondary | ICD-10-CM

## 2012-11-26 DIAGNOSIS — W19XXXA Unspecified fall, initial encounter: Secondary | ICD-10-CM

## 2012-11-26 LAB — CBC
MCH: 29.5 pg (ref 26.0–34.0)
MCV: 88.8 fL (ref 78.0–100.0)
Platelets: 312 10*3/uL (ref 150–400)
RBC: 4.2 MIL/uL (ref 3.87–5.11)

## 2012-11-26 NOTE — MAU Note (Signed)
Pt reports she was walking in parking lot and did not see the curb. Fell intially  on her knee but hit stomach as well. Felt pulling sensation in abd . Has felt baby move since and denies andy vag bleeding at this time

## 2012-11-26 NOTE — MAU Note (Signed)
Knees washed with warm water and bandaids applied. Minor scrapes on both knees from fall. Does have varicosities bilaterally.

## 2012-11-26 NOTE — MAU Provider Note (Signed)
  History     CSN: [id number]  Arrival date and time: 11/26/12 1919   None     Chief Complaint  Patient presents with  . Fall   HPI  Pt is [id number] @ [redacted]w[redacted]d pregnant who presents after falling on a curb at 7 pm. Pt initially caught herself with her hands and then fell and hit her abdomen.  Pt had a pulling sensation in her abdomen initially but not now.  Pt denies spotting or bleeding.  Past Medical History  Diagnosis Date  . AMA (advanced maternal age) multigravida 35+   . Endometriosis   . PONV (postoperative nausea and vomiting)     Past Surgical History  Procedure Date  . Laparoscopy   . Appendectomy   . Tonsillectomy   . Breast reduction surgery     Family History  Problem Relation Age of Onset  . Other Neg Hx     History  Substance Use Topics  . Smoking status: Not on file  . Smokeless tobacco: Never Used  . Alcohol Use: No    Allergies: No Known Allergies  Prescriptions prior to admission  Medication Sig Dispense Refill  . FIRST-PROGESTERONE VGS 400 400 MG SUPP       . miconazole (MONISTAT-3) KIT Place vaginally at bedtime.      . Prenatal Vit w/Fe-Methylfol-FA (PNV PO) Take by mouth.        Review of Systems  Constitutional: Negative for fever and chills.  Gastrointestinal: Negative for nausea, vomiting, abdominal pain, diarrhea and constipation.  Genitourinary: Negative for dysuria and urgency.  Neurological: Negative for dizziness.   Physical Exam   Blood pressure 123/56, pulse 93, temperature 98.1 F (36.7 C), temperature source Oral, resp. rate 18, height 5\' 5"  (1.651 m), weight 188 lb 3.2 oz (85.367 kg), last menstrual period 05/06/2012.  Physical Exam  Nursing note and vitals reviewed. Constitutional: She is oriented to person, place, and time. She appears well-developed and well-nourished.  HENT:  Head: Normocephalic.  Eyes: Pupils are equal, round, and reactive to light.  Neck: Normal range of motion. Neck supple.  Cardiovascular:  Normal rate.   Respiratory: Effort normal.  GI: Soft. She exhibits no distension. There is no tenderness. There is no rebound and no guarding.       FHR reassuring for gestational age; occ ctx; abdomen soft nontender  Musculoskeletal: Normal range of motion.  Neurological: She is alert and oriented to person, place, and time.  Skin: Skin is warm and dry.  Psychiatric: She has a normal mood and affect.    MAU Course  Procedures Discussed with Dr. Gaynell Face- will observe for 4 hours then send home Pt not having any pain or contractions Care turned over to Napoleon Form, MD  Assessment and Plan  Fall at 29 weeks  Plan Baby has been reactive on monitor.   FHT Baseline 135 - good accelerations noted.  Very mild contractions - not felt by client. Will discharge. Follow up in the office as scheduled.  LINEBERRY,SUSAN 11/26/2012, 8:04 PM

## 2012-12-09 NOTE — L&D Delivery Note (Signed)
Delivery Note At 1:07 PM a viable female was delivered via  (Presentation: Direct Occiput Posterior).  APGAR: 5, 9; weight .   Placenta status: Intact, Spontaneous Pathology.  Cord: 3 vessels with the following complications: None.  Cord pH: 7.13  Anesthesia: Epidural  Episiotomy: None Lacerations: 2nd degree Suture Repair: 2.0 3.0 vicryl rapide Est. Blood Loss (mL): 200 ml  Mom to postpartum.  Baby to nursery-stable. Likely small abruption shortly before delivery. JACKSON-MOORE,LISA A 02/11/2013, 1:40 PM

## 2012-12-14 ENCOUNTER — Ambulatory Visit (HOSPITAL_COMMUNITY)
Admission: RE | Admit: 2012-12-14 | Discharge: 2012-12-14 | Disposition: A | Discharge status: Home / Self Care | Payer: BC Managed Care – PPO | Source: Ambulatory Visit | Attending: Obstetrics & Gynecology | Admitting: Obstetrics & Gynecology

## 2012-12-14 DIAGNOSIS — O09529 Supervision of elderly multigravida, unspecified trimester: Secondary | ICD-10-CM | POA: Insufficient documentation

## 2012-12-14 DIAGNOSIS — O0990 Supervision of high risk pregnancy, unspecified, unspecified trimester: Secondary | ICD-10-CM

## 2012-12-14 DIAGNOSIS — O2620 Pregnancy care for patient with recurrent pregnancy loss, unspecified trimester: Secondary | ICD-10-CM | POA: Insufficient documentation

## 2013-01-14 ENCOUNTER — Other Ambulatory Visit: Payer: Self-pay | Admitting: Obstetrics

## 2013-01-14 DIAGNOSIS — O409XX0 Polyhydramnios, unspecified trimester, not applicable or unspecified: Secondary | ICD-10-CM

## 2013-01-20 ENCOUNTER — Other Ambulatory Visit: Payer: Self-pay | Admitting: Obstetrics

## 2013-01-20 ENCOUNTER — Ambulatory Visit (HOSPITAL_COMMUNITY)
Admission: RE | Admit: 2013-01-20 | Discharge: 2013-01-20 | Disposition: A | Discharge status: Home / Self Care | Payer: BC Managed Care – PPO | Source: Ambulatory Visit | Attending: Obstetrics | Admitting: Obstetrics

## 2013-01-20 DIAGNOSIS — O409XX0 Polyhydramnios, unspecified trimester, not applicable or unspecified: Secondary | ICD-10-CM

## 2013-01-20 DIAGNOSIS — O2620 Pregnancy care for patient with recurrent pregnancy loss, unspecified trimester: Secondary | ICD-10-CM | POA: Insufficient documentation

## 2013-01-20 DIAGNOSIS — O09529 Supervision of elderly multigravida, unspecified trimester: Secondary | ICD-10-CM | POA: Insufficient documentation

## 2013-02-05 ENCOUNTER — Telehealth (HOSPITAL_COMMUNITY): Payer: Self-pay | Admitting: *Deleted

## 2013-02-05 ENCOUNTER — Encounter (HOSPITAL_COMMUNITY): Payer: Self-pay | Admitting: *Deleted

## 2013-02-05 NOTE — Telephone Encounter (Signed)
Preadmission screen  

## 2013-02-10 ENCOUNTER — Inpatient Hospital Stay (HOSPITAL_COMMUNITY)
Admission: AD | Admit: 2013-02-10 | Discharge: 2013-02-13 | DRG: 372 | Disposition: A | Discharge status: Home / Self Care | Payer: BC Managed Care – PPO | Source: Ambulatory Visit | Attending: Obstetrics & Gynecology | Admitting: Obstetrics & Gynecology

## 2013-02-10 DIAGNOSIS — O4590 Premature separation of placenta, unspecified, unspecified trimester: Secondary | ICD-10-CM | POA: Diagnosis not present

## 2013-02-10 DIAGNOSIS — O4290 Premature rupture of membranes, unspecified as to length of time between rupture and onset of labor, unspecified weeks of gestation: Principal | ICD-10-CM | POA: Diagnosis present

## 2013-02-10 DIAGNOSIS — O09529 Supervision of elderly multigravida, unspecified trimester: Secondary | ICD-10-CM | POA: Diagnosis present

## 2013-02-10 LAB — CBC
MCHC: 33.4 g/dL (ref 30.0–36.0)
RDW: 13.8 % (ref 11.5–15.5)
WBC: 14.2 10*3/uL — ABNORMAL HIGH (ref 4.0–10.5)

## 2013-02-10 LAB — POCT FERN TEST

## 2013-02-10 MED ORDER — LACTATED RINGERS IV SOLN
INTRAVENOUS | Status: DC
  Administered 2013-02-10 – 2013-02-11 (×2): via INTRAVENOUS

## 2013-02-10 MED ORDER — OXYTOCIN BOLUS FROM INFUSION: 500.0000 mL | INTRAVENOUS | Status: DC

## 2013-02-10 MED ORDER — LIDOCAINE HCL (PF) 1 % IJ SOLN
30.0000 mL | INTRAMUSCULAR | Status: DC | PRN
  Filled 2013-02-10 (×2): qty 30

## 2013-02-10 MED ORDER — CITRIC ACID-SODIUM CITRATE 334-500 MG/5ML PO SOLN: 30.0000 mL | ORAL | Status: DC | PRN

## 2013-02-10 MED ORDER — ONDANSETRON HCL 4 MG/2ML IJ SOLN: 4.0000 mg | Freq: Four times a day (QID) | INTRAMUSCULAR | Status: DC | PRN

## 2013-02-10 MED ORDER — LACTATED RINGERS IV SOLN: 500.0000 mL | INTRAVENOUS | Status: DC | PRN

## 2013-02-10 MED ORDER — IBUPROFEN 600 MG PO TABS: 600.0000 mg | ORAL_TABLET | Freq: Four times a day (QID) | ORAL | Status: DC | PRN

## 2013-02-10 MED ORDER — ACETAMINOPHEN 325 MG PO TABS: 650.0000 mg | ORAL_TABLET | ORAL | Status: DC | PRN

## 2013-02-10 MED ORDER — OXYTOCIN 40 UNITS IN LACTATED RINGERS INFUSION - SIMPLE MED
62.5000 mL/h | INTRAVENOUS | Status: DC
  Administered 2013-02-11: 999 mL/h via INTRAVENOUS

## 2013-02-10 MED ORDER — OXYCODONE-ACETAMINOPHEN 5-325 MG PO TABS: 1.0000 | ORAL_TABLET | ORAL | Status: DC | PRN

## 2013-02-10 MED ORDER — FLEET ENEMA 7-19 GM/118ML RE ENEM: 1.0000 | ENEMA | RECTAL | Status: DC | PRN

## 2013-02-10 NOTE — H&P (Signed)
Patricia Thornton is a 42 y.o. female presenting for SROM. Maternal Medical History:  Reason for admission: Rupture of membranes.   Fetal activity: Perceived fetal activity is normal.    Prenatal complications: Infection: UTI.     OB History   Grav Para Term Preterm Abortions TAB SAB Ect Mult Living   6 2 2  0 3 0 3 0 0 2     Past Medical History  Diagnosis Date  . AMA (advanced maternal age) multigravida 35+   . Endometriosis   . PONV (postoperative nausea and vomiting)    Past Surgical History  Procedure Laterality Date  . Laparoscopy    . Appendectomy    . Tonsillectomy    . Breast reduction surgery     Family History: family history includes Cancer in her maternal grandmother and paternal grandfather; Diabetes in her maternal grandmother; and Stroke in her paternal grandmother.  There is no history of Other. Social History:  reports that she has never smoked. She has never used smokeless tobacco. She reports that she does not drink alcohol or use illicit drugs.     Review of Systems  Constitutional: Negative for fever.  Eyes: Negative for blurred vision.  Respiratory: Negative for shortness of breath.   Gastrointestinal: Negative for vomiting.  Skin: Negative for rash.  Neurological: Negative for headaches.    Dilation: 2.5 Effacement (%): 70 Station: -2 Exam by:: heather hogan cnm Blood pressure 135/66, pulse 88, resp. rate 18, height 5\' 5"  (1.651 m), weight 92.194 kg (203 lb 4 oz), last menstrual period 05/06/2012, SpO2 97.00%. Maternal Exam:  Abdomen: Fetal presentation: vertex  Introitus: not evaluated.   Cervix: Cervix evaluated by digital exam.     Fetal Exam Fetal Monitor Review: Variability: moderate (6-25 bpm).   Pattern: accelerations present and no decelerations.    Fetal State Assessment: Category I - tracings are normal.     Physical Exam  Constitutional: She appears well-developed.  HENT:  Head: Normocephalic.  Neck: Neck supple. No  thyromegaly present.  Cardiovascular: Normal rate and regular rhythm.   Respiratory: Breath sounds normal.  GI: Soft. Bowel sounds are normal.  Skin: No rash noted.    Prenatal labs: ABO, Rh: --/--/O POS (12/19 2025) Antibody: Negative (08/06 0000) Rubella: Immune (08/06 0000) RPR: Nonreactive (08/06 0000)  HBsAg: Negative (08/06 0000)  HIV: Non-reactive (08/06 0000)  GBS: Negative (02/06 0000)   Assessment/Plan: Multipara @ [redacted]w[redacted]d.  PROM.  GBS negative.  Category I FHT  Admit The pt elects proceed w/expectant management; await spontaneous labor Monitor for signs/symptoms of chorioamnionitis   JACKSON-MOORE,LISA A 02/10/2013, 10:24 PM

## 2013-02-10 NOTE — MAU Note (Signed)
Patient is in with c/o srom of clear fluid at 1905pm. She states that she is having irregular contractions q59mins. She reports decreased fetal movement. fht 140-145.

## 2013-02-10 NOTE — Progress Notes (Signed)
Dr Tamela Oddi notified of patient, srom, sve result and tracing. Order to admit. Ask L/D staff to call when patient is 7cm.

## 2013-02-11 ENCOUNTER — Encounter (HOSPITAL_COMMUNITY): Payer: Self-pay | Admitting: Anesthesiology

## 2013-02-11 ENCOUNTER — Inpatient Hospital Stay (HOSPITAL_COMMUNITY): Payer: BC Managed Care – PPO | Admitting: Anesthesiology

## 2013-02-11 LAB — RPR: RPR Ser Ql: NONREACTIVE

## 2013-02-11 MED ORDER — TERBUTALINE SULFATE 1 MG/ML IJ SOLN: 0.2500 mg | Freq: Once | INTRAMUSCULAR | Status: DC | PRN

## 2013-02-11 MED ORDER — FENTANYL 2.5 MCG/ML BUPIVACAINE 1/10 % EPIDURAL INFUSION (WH - ANES)
14.0000 mL/h | INTRAMUSCULAR | Status: DC
  Filled 2013-02-11: qty 125

## 2013-02-11 MED ORDER — EPHEDRINE 5 MG/ML INJ
10.0000 mg | INTRAVENOUS | Status: DC | PRN
  Filled 2013-02-11: qty 4

## 2013-02-11 MED ORDER — OXYCODONE-ACETAMINOPHEN 5-325 MG PO TABS
1.0000 | ORAL_TABLET | ORAL | Status: DC | PRN
  Administered 2013-02-13: 1 via ORAL
  Filled 2013-02-11: qty 1

## 2013-02-11 MED ORDER — DIPHENHYDRAMINE HCL 25 MG PO CAPS: 25.0000 mg | ORAL_CAPSULE | Freq: Four times a day (QID) | ORAL | Status: DC | PRN

## 2013-02-11 MED ORDER — IBUPROFEN 600 MG PO TABS
600.0000 mg | ORAL_TABLET | Freq: Four times a day (QID) | ORAL | Status: DC
  Administered 2013-02-11 – 2013-02-13 (×8): 600 mg via ORAL
  Filled 2013-02-11 (×8): qty 1

## 2013-02-11 MED ORDER — LIDOCAINE HCL (PF) 1 % IJ SOLN
INTRAMUSCULAR | Status: DC | PRN
  Administered 2013-02-11 (×2): 8 mL

## 2013-02-11 MED ORDER — TETANUS-DIPHTH-ACELL PERTUSSIS 5-2.5-18.5 LF-MCG/0.5 IM SUSP
0.5000 mL | Freq: Once | INTRAMUSCULAR | Status: AC
  Administered 2013-02-12: 0.5 mL via INTRAMUSCULAR
  Filled 2013-02-11: qty 0.5

## 2013-02-11 MED ORDER — ONDANSETRON HCL 4 MG/2ML IJ SOLN: 4.0000 mg | INTRAMUSCULAR | Status: DC | PRN

## 2013-02-11 MED ORDER — PRENATAL MULTIVITAMIN CH
1.0000 | ORAL_TABLET | Freq: Every day | ORAL | Status: DC
  Administered 2013-02-12 – 2013-02-13 (×2): 1 via ORAL
  Filled 2013-02-11 (×2): qty 1

## 2013-02-11 MED ORDER — DIPHENHYDRAMINE HCL 50 MG/ML IJ SOLN: 12.5000 mg | INTRAMUSCULAR | Status: DC | PRN

## 2013-02-11 MED ORDER — OXYTOCIN 40 UNITS IN LACTATED RINGERS INFUSION - SIMPLE MED
1.0000 m[IU]/min | INTRAVENOUS | Status: DC
  Administered 2013-02-11: 2 m[IU]/min via INTRAVENOUS
  Filled 2013-02-11: qty 1000

## 2013-02-11 MED ORDER — BENZOCAINE-MENTHOL 20-0.5 % EX AERO
1.0000 "application " | INHALATION_SPRAY | CUTANEOUS | Status: DC | PRN
  Administered 2013-02-11: 1 via TOPICAL
  Filled 2013-02-11: qty 56

## 2013-02-11 MED ORDER — ZOLPIDEM TARTRATE 5 MG PO TABS: 5.0000 mg | ORAL_TABLET | Freq: Every evening | ORAL | Status: DC | PRN

## 2013-02-11 MED ORDER — PHENYLEPHRINE 40 MCG/ML (10ML) SYRINGE FOR IV PUSH (FOR BLOOD PRESSURE SUPPORT): 80.0000 ug | PREFILLED_SYRINGE | INTRAVENOUS | Status: DC | PRN

## 2013-02-11 MED ORDER — ONDANSETRON HCL 4 MG PO TABS: 4.0000 mg | ORAL_TABLET | ORAL | Status: DC | PRN

## 2013-02-11 MED ORDER — FENTANYL 2.5 MCG/ML BUPIVACAINE 1/10 % EPIDURAL INFUSION (WH - ANES)
INTRAMUSCULAR | Status: DC | PRN
  Administered 2013-02-11: 14 mL/h via EPIDURAL

## 2013-02-11 MED ORDER — SENNOSIDES-DOCUSATE SODIUM 8.6-50 MG PO TABS
2.0000 | ORAL_TABLET | Freq: Every day | ORAL | Status: DC
  Administered 2013-02-11 – 2013-02-12 (×2): 2 via ORAL

## 2013-02-11 MED ORDER — EPHEDRINE 5 MG/ML INJ: 10.0000 mg | INTRAVENOUS | Status: DC | PRN

## 2013-02-11 MED ORDER — PHENYLEPHRINE 40 MCG/ML (10ML) SYRINGE FOR IV PUSH (FOR BLOOD PRESSURE SUPPORT)
80.0000 ug | PREFILLED_SYRINGE | INTRAVENOUS | Status: DC | PRN
  Filled 2013-02-11: qty 5

## 2013-02-11 MED ORDER — FERROUS SULFATE 325 (65 FE) MG PO TABS
325.0000 mg | ORAL_TABLET | Freq: Two times a day (BID) | ORAL | Status: DC
  Administered 2013-02-11 – 2013-02-13 (×4): 325 mg via ORAL
  Filled 2013-02-11 (×4): qty 1

## 2013-02-11 MED ORDER — MAGNESIUM HYDROXIDE 400 MG/5ML PO SUSP: 30.0000 mL | ORAL | Status: DC | PRN

## 2013-02-11 MED ORDER — LANOLIN HYDROUS EX OINT: TOPICAL_OINTMENT | CUTANEOUS | Status: DC | PRN

## 2013-02-11 MED ORDER — MEASLES, MUMPS & RUBELLA VAC ~~LOC~~ INJ: 0.5000 mL | INJECTION | Freq: Once | SUBCUTANEOUS | Status: DC

## 2013-02-11 MED ORDER — LACTATED RINGERS IV SOLN
500.0000 mL | Freq: Once | INTRAVENOUS | Status: AC
  Administered 2013-02-11: 500 mL via INTRAVENOUS

## 2013-02-11 MED ORDER — DIBUCAINE 1 % RE OINT: 1.0000 "application " | TOPICAL_OINTMENT | RECTAL | Status: DC | PRN

## 2013-02-11 MED ORDER — WITCH HAZEL-GLYCERIN EX PADS: 1.0000 "application " | MEDICATED_PAD | CUTANEOUS | Status: DC | PRN

## 2013-02-11 NOTE — Anesthesia Procedure Notes (Signed)
Epidural Patient location during procedure: OB Start time: 02/11/2013 10:32 AM End time: 02/11/2013 10:37 AM  Staffing Anesthesiologist: Sandrea Hughs Performed by: anesthesiologist   Preanesthetic Checklist Completed: patient identified, site marked, surgical consent, pre-op evaluation, timeout performed, IV checked, risks and benefits discussed and monitors and equipment checked  Epidural Patient position: sitting Prep: site prepped and draped and DuraPrep Patient monitoring: continuous pulse ox and blood pressure Approach: midline Injection technique: LOR air  Needle:  Needle type: Tuohy  Needle gauge: 17 G Needle length: 9 cm and 9 Needle insertion depth: 7 cm Catheter type: closed end flexible Catheter size: 19 Gauge Catheter at skin depth: 11 cm Test dose: negative and Other  Assessment Sensory level: T9 Events: blood not aspirated, injection not painful, no injection resistance, negative IV test and no paresthesia  Additional Notes Reason for block:procedure for pain

## 2013-02-11 NOTE — Progress Notes (Signed)
Dr Tamela Oddi called and notified pt 8 cm and requested MD to come for delivery

## 2013-02-11 NOTE — Progress Notes (Signed)
TAVARIA MACKINS is a 42 y.o. [id number] at [redacted]w[redacted]d by LMP admitted for rupture of membranes  Subjective: Comfortable  Objective: BP 113/63  Pulse 92  Temp(Src) 97.5 F (36.4 C) (Oral)  Resp 20  Ht 5\' 5"  (1.651 m)  Wt 92.194 kg (203 lb 4 oz)  BMI 33.82 kg/m2  SpO2 97%  LMP 05/06/2012      FHT:  FHR: 120 bpm, variability: moderate,  accelerations:  Present,  decelerations:  Absent UC:   irregular, every 5 minutes SVE:   Dilation: 2.5 Effacement (%): 70 Station: -2 Exam by:: heather hogan cnm  Labs: Lab Results  Component Value Date   WBC 14.2* 02/10/2013   HGB 12.7 02/10/2013   HCT 38.0 02/10/2013   MCV 86.8 02/10/2013   PLT 288 02/10/2013    Assessment / Plan: PROM  Labor: prodrome Preeclampsia:  n/a Fetal Wellbeing:  Category I Pain Control:  Labor support without medications I/D:  no overt signs of infection Anticipated MOD:  NSVD  JACKSON-MOORE,Usman Millett A 02/11/2013, 8:38 AM

## 2013-02-11 NOTE — Consult Note (Signed)
Neonatology Note:  Attendance at Code Apgar:   Our team responded to a Code Apgar call to room # 169 following NSVD, due to infant with apnea. The mother is a G6P2A3 O pos, GBS neg with endometriosis. ROM occurred 18 hours PTD and the fluid was clear. Mother was afebrile during labor.  A partial placental abruption was suspected. At delivery, the baby cried once, then had irregular breathing but the HR remained above 100.. The OB nursing staff in attendance gave vigorous stimulation and a Code Apgar was called. Our team arrived at 4  minutes of life, at which time the baby was breathing, with HR > 100, and decreased perfusion. We placed a pulse oximeter, which showed the O2 saturation was 91% in room air. Ap 5/9. The baby's tone was normal at 5 minutes and perfusion had also normalized.  I spoke with the parents in the DR, then transferred the baby to the Pediatrician's care.   Doretha Sou, MD

## 2013-02-11 NOTE — Progress Notes (Signed)
300 ccd

## 2013-02-11 NOTE — Progress Notes (Signed)
Pt has strong urge to push.

## 2013-02-11 NOTE — Anesthesia Preprocedure Evaluation (Signed)

## 2013-02-11 NOTE — Progress Notes (Signed)
Dr Anastasia Fiedler here

## 2013-02-12 NOTE — Progress Notes (Signed)
Post Partum Day 1 Subjective: no complaints  Objective: Blood pressure 100/66, pulse 88, temperature 97.7 F (36.5 C), temperature source Oral, resp. rate 18, height 5\' 5"  (1.651 m), weight 203 lb 4 oz (92.194 kg), last menstrual period 05/06/2012, SpO2 97.00%, unknown if currently breastfeeding.  Physical Exam:  General: alert and no distress Lochia: appropriate Uterine Fundus: firm Incision: none DVT Evaluation: No evidence of DVT seen on physical exam.   Recent Labs  02/10/13 2156  HGB 12.7  HCT 38.0    Assessment/Plan: Plan for discharge tomorrow   LOS: 2 days   HARPER,CHARLES A 02/12/2013, 8:44 AM

## 2013-02-12 NOTE — Anesthesia Postprocedure Evaluation (Signed)
  Anesthesia Post-op Note  Patient: Patricia Thornton  Procedure(s) Performed: * No procedures listed *  Patient Location: Mother/Baby  Anesthesia Type:Epidural  Level of Consciousness: awake, alert , oriented and patient cooperative  Airway and Oxygen Therapy: Patient Spontanous Breathing  Post-op Pain: mild  Post-op Assessment: Patient's Cardiovascular Status Stable and Respiratory Function Stable  Post-op Vital Signs: stable  Complications: No apparent anesthesia complications

## 2013-02-13 NOTE — Discharge Summary (Signed)
Obstetric Discharge Summary Reason for Admission: onset of labor Prenatal Procedures: none Intrapartum Procedures: spontaneous vaginal delivery Postpartum Procedures: none Complications-Operative and Postpartum: none Hemoglobin  Date Value Range Status  02/10/2013 12.7  12.0 - 15.0 g/dL Final     HCT  Date Value Range Status  02/10/2013 38.0  36.0 - 46.0 % Final    Physical Exam:  General: alert Lochia: appropriate Uterine Fundus: firm Incision: healing well DVT Evaluation: No evidence of DVT seen on physical exam.  Discharge Diagnoses: Term Pregnancy-delivered  Discharge Information: Date: 02/13/2013 Activity: pelvic rest Diet: routine Medications: Percocet Condition: stable Instructions: refer to practice specific booklet Discharge to: home Follow-up Information   Follow up with HARPER,CHARLES A, MD In 6 weeks.   Contact information:   [address] [place] [address] [phone]       Newborn Data: Live born female  Birth Weight: 7 lb 0.7 oz (3195 g) APGAR: 5, 9  Home with mother.  MARSHALL,BERNARD A 02/13/2013, 6:38 AM

## 2013-02-18 ENCOUNTER — Ambulatory Visit (HOSPITAL_COMMUNITY): Admission: RE | Admit: 2013-02-18 | Payer: BC Managed Care – PPO | Source: Ambulatory Visit

## 2013-02-19 ENCOUNTER — Ambulatory Visit (HOSPITAL_COMMUNITY)
Admission: RE | Admit: 2013-02-19 | Discharge: 2013-02-19 | Disposition: A | Discharge status: Home / Self Care | Payer: BC Managed Care – PPO | Source: Ambulatory Visit | Attending: Obstetrics & Gynecology | Admitting: Obstetrics & Gynecology

## 2013-02-19 NOTE — Lactation Note (Signed)
Adult Lactation Consultation Outpatient Visit Note  Patient Name: Patricia Thornton      BABY: Sherryll Burger Date of Birth: 11-13-1971            DOB: [birth year] Gestational Age at Delivery: [redacted]w[redacted]d    BIRTH WEIGHT: 7-0.7 Type of Delivery: NVD                          DISCHARGE WEIGHT: 6-12.3                                                               WEIGHT TODAY: 6-11.8 Breastfeeding History: Frequency of Breastfeeding: EVERY 3 HOURS Length of Feeding: 15-30 MINUTES Voids: QS Stools: QS  Supplementing / Method:FORMULA 60 MLS SNS EVERY 3 HOURS Pumping:  Type of Pump:DEBP   Frequency:NONE  Volume:    Comments:    Consultation Evaluation:Mom and 8 day old baby here for feeding assessment. Mom has a history of breast reduction at age 14.  She nursed previous x 4-5 months using a SNS.  She states the most she could pump at any time was 30 mls or less.  Baby has been nursing with SNS since birth.  Mom reports feedings going well although sometimes nipple pain on right side which at times radiates into breast.  Mom reports breasts and nipples have been sensitive since surgery.  She reports feeling some breast fullness a few days ago but no fullness or leaking.  She has a 26 and 43 year old and is busy with home schooling.  Mom asking if she should pump like she did with previous 2 babies.  Discussed that with her baby nursing well and reason for insufficient supply is due to lack of functioning breast tissue the benefit vs stress of pumping may not be beneficial.  Assisted mom with positioning and latch technique using cross cradle hold.  Baby opens wide and latches easily with wide gape.  Baby nursed very well on both breasts with few swallows.  Milk transfer for total feeding.  Mom then independently latched baby back to breast with SNS and baby again latched well and finished supplement in 15 minutes.  Reviewed increasing formula amounts as baby desires.  Mom has a good attitude and is motivated to feed  her baby at breast with SNS and expresses much satisfaction from breastfeeding.  Encouraged mom to call with concerns or if she desires a follow up appointment for pre and post weighing.   Initial Feeding Assessment:LEFT SIDE 25 MINUTES/RIGHT SIDE 30 MINUTES Pre-feed [id number] Post-feed [id number] Amount Transferred:4 MLS Comments:  Additional Feeding Assessment: Pre-feed Weight: Post-feed Weight: Amount Transferred: Comments:  Additional Feeding Assessment: Pre-feed Weight: Post-feed Weight: Amount Transferred: Comments:  Total Breast milk Transferred this Visit: 4 MLS Total Supplement Given: 60 MLS FORMULA  Additional Interventions:   Follow-Up  WILL CALL PRN      Hansel Feinstein 02/19/2013, 1:56 PM

## 2013-02-24 ENCOUNTER — Telehealth (HOSPITAL_COMMUNITY): Payer: Self-pay | Admitting: *Deleted

## 2013-02-24 NOTE — Telephone Encounter (Signed)
Resolve episode 

## 2013-03-29 ENCOUNTER — Ambulatory Visit: Payer: Self-pay | Admitting: Obstetrics & Gynecology

## 2013-03-31 ENCOUNTER — Encounter: Payer: Self-pay | Admitting: Obstetrics & Gynecology

## 2013-03-31 ENCOUNTER — Ambulatory Visit (INDEPENDENT_AMBULATORY_CARE_PROVIDER_SITE_OTHER): Payer: BC Managed Care – PPO | Admitting: Obstetrics & Gynecology

## 2013-03-31 NOTE — Patient Instructions (Signed)
Calorie Counting Diet A calorie counting diet requires you to eat the number of calories that are right for you in a day. Calories are the measurement of how much energy you get from the food you eat. Eating the right amount of calories is important for staying at a healthy weight. If you eat too many calories, your body will store them as fat and you may gain weight. If you eat too few calories, you may lose weight. Counting the number of calories you eat during a day will help you know if you are eating the right amount. A Registered Dietitian can determine how many calories you need in a day. The amount of calories needed varies from person to person. If your goal is to lose weight, you will need to eat fewer calories. Losing weight can benefit you if you are overweight or have health problems such as heart disease, high blood pressure, or diabetes. If your goal is to gain weight, you will need to eat more calories. Gaining weight may be necessary if you have a certain health problem that causes your body to need more energy. TIPS Whether you are increasing or decreasing the number of calories you eat during a day, it may be hard to get used to changes in what you eat and drink. The following are tips to help you keep track of the number of calories you eat.  Measure foods at home with measuring cups. This helps you know the amount of food and number of calories you are eating.  Restaurants often serve food in amounts that are larger than 1 serving. While eating out, estimate how many servings of a food you are given. For example, a serving of cooked rice is  cup or about the size of half of a fist. Knowing serving sizes will help you be aware of how much food you are eating at restaurants.  Ask for smaller portion sizes or child-size portions at restaurants.  Plan to eat half of a meal at a restaurant. Take the rest home or share the other half with a friend.  Read the Nutrition Facts panel on  food labels for calorie content and serving size. You can find out how many servings are in a package, the size of a serving, and the number of calories each serving has.  For example, a package might contain 3 cookies. The Nutrition Facts panel on that package says that 1 serving is 1 cookie. Below that, it will say there are 3 servings in the container. The calories section of the Nutrition Facts label says there are 90 calories. This means there are 90 calories in 1 cookie (1 serving). If you eat 1 cookie you have eaten 90 calories. If you eat all 3 cookies, you have eaten 270 calories (3 servings x 90 calories = 270 calories). The list below tells you how big or small some common portion sizes are.  1 oz.........4 stacked dice.  3 oz.........Deck of cards.  1 tsp........Tip of little finger.  1 tbs........Thumb.  2 tbs........Golf ball.   cup.......Half of a fist.  1 cup........A fist. KEEP A FOOD LOG Write down every food item you eat, the amount you eat, and the number of calories in each food you eat during the day. At the end of the day, you can add up the total number of calories you have eaten. It may help to keep a list like the one below. Find out the calorie information by reading the   Nutrition Facts panel on food labels. Breakfast  Bran cereal (1 cup, 110 calories).  Fat-free milk ( cup, 45 calories). Snack  Apple (1 medium, 80 calories). Lunch  Spinach (1 cup, 20 calories).  Tomato ( medium, 20 calories).  Chicken breast strips (3 oz, 165 calories).  Shredded cheddar cheese ( cup, 110 calories).  Light Italian dressing (2 tbs, 60 calories).  Whole-wheat bread (1 slice, 80 calories).  Tub margarine (1 tsp, 35 calories).  Vegetable soup (1 cup, 160 calories). Dinner  Pork chop (3 oz, 190 calories).  Brown rice (1 cup, 215 calories).  Steamed broccoli ( cup, 20 calories).  Strawberries (1  cup, 65 calories).  Whipped cream (1 tbs, 50  calories). Daily Calorie Total: 1425 Document Released: 11/25/2005 Document Revised: 02/17/2012 Document Reviewed: 05/22/2007 [employer] Patient Information 2013 [employer].  

## 2013-03-31 NOTE — Progress Notes (Signed)
.   Subjective:     Patricia Thornton is a 42 y.o. female who presents for a postpartum visit. She is 7  weeks postpartum following a spontaneous vaginal delivery. I have fully reviewed the prenatal and intrapartum course. The delivery was at 40.1 gestational weeks. Outcome: spontaneous vaginal delivery. Anesthesia: epidural. Postpartum course has been normal. Baby's course has been normal. Baby is feeding by both breast and bottle - Similac Advance. Bleeding no bleeding. Bowel function is normal. Bladder function is normal. Patient is not sexually active. Contraception method is none. Postpartum depression screening: negative.  The following portions of the patient's history were reviewed and updated as appropriate: allergies, current medications, past family history, past medical history, past social history, past surgical history and problem list.  Review of Systems Pertinent items are noted in HPI.   Objective:    BP 96/63  Pulse 84  Temp(Src) 98.2 F (36.8 C) (Oral)  Ht 5\' 5"  (1.651 m)  Wt 182 lb (82.555 kg)  BMI 30.29 kg/m2  Breastfeeding? Yes        General:  alert     Abdomen: soft, non-tender; bowel sounds normal; no masses,  no organomegaly   Vulva:  normal  Vagina: normal vagina  Cervix:  no lesions  Corpus: normal size, contour, position, consistency, mobility, non-tender  Adnexa:  normal adnexa   General:  alert     Abdomen: soft, non-tender; bowel sounds normal; no masses,  no organomegaly   Vulva:  normal  Vagina: normal vagina  Cervix:  no lesions  Corpus: normal size, contour, position, consistency, mobility, non-tender  Adnexa:  normal adnexa   Assessment:     Normal postpartum exam.  Plan:     Follow up in: 1 year or as needed.

## 2014-03-03 ENCOUNTER — Other Ambulatory Visit: Payer: Self-pay | Admitting: Obstetrics & Gynecology

## 2014-03-03 ENCOUNTER — Ambulatory Visit (HOSPITAL_COMMUNITY)
Admission: RE | Admit: 2014-03-03 | Discharge: 2014-03-03 | Disposition: A | Discharge status: Home / Self Care | Payer: BC Managed Care – PPO | Source: Ambulatory Visit | Attending: Obstetrics & Gynecology | Admitting: Obstetrics & Gynecology

## 2014-03-03 DIAGNOSIS — Z1231 Encounter for screening mammogram for malignant neoplasm of breast: Secondary | ICD-10-CM

## 2014-05-09 ENCOUNTER — Other Ambulatory Visit: Payer: Self-pay | Admitting: Obstetrics & Gynecology

## 2014-05-09 DIAGNOSIS — Z1231 Encounter for screening mammogram for malignant neoplasm of breast: Secondary | ICD-10-CM

## 2014-05-12 ENCOUNTER — Ambulatory Visit (HOSPITAL_COMMUNITY)
Admission: RE | Admit: 2014-05-12 | Discharge: 2014-05-12 | Disposition: A | Discharge status: Home / Self Care | Payer: BC Managed Care – PPO | Source: Ambulatory Visit | Attending: Obstetrics & Gynecology | Admitting: Obstetrics & Gynecology

## 2014-05-12 DIAGNOSIS — Z1231 Encounter for screening mammogram for malignant neoplasm of breast: Secondary | ICD-10-CM | POA: Insufficient documentation

## 2014-10-10 ENCOUNTER — Encounter: Payer: Self-pay | Admitting: Obstetrics & Gynecology

## 2014-12-05 ENCOUNTER — Encounter: Payer: Self-pay | Admitting: *Deleted

## 2014-12-06 ENCOUNTER — Encounter: Payer: Self-pay | Admitting: Obstetrics & Gynecology

## 2015-06-14 ENCOUNTER — Ambulatory Visit (INDEPENDENT_AMBULATORY_CARE_PROVIDER_SITE_OTHER): Payer: BLUE CROSS/BLUE SHIELD | Admitting: Certified Nurse Midwife

## 2015-06-14 ENCOUNTER — Encounter: Payer: Self-pay | Admitting: Certified Nurse Midwife

## 2015-06-14 VITALS — BP 115/70 | HR 78 | Temp 97.8°F | Ht 65.0 in | Wt 194.0 lb

## 2015-06-14 DIAGNOSIS — Z01419 Encounter for gynecological examination (general) (routine) without abnormal findings: Secondary | ICD-10-CM

## 2015-06-14 DIAGNOSIS — E669 Obesity, unspecified: Secondary | ICD-10-CM

## 2015-06-14 DIAGNOSIS — L309 Dermatitis, unspecified: Secondary | ICD-10-CM

## 2015-06-14 LAB — CBC WITH DIFFERENTIAL/PLATELET
Basophils Absolute: 0.1 10*3/uL (ref 0.0–0.1)
Basophils Relative: 1 % (ref 0–1)
EOS PCT: 2 % (ref 0–5)
Eosinophils Absolute: 0.1 10*3/uL (ref 0.0–0.7)
HEMATOCRIT: 37.8 % (ref 36.0–46.0)
HEMOGLOBIN: 12.8 g/dL (ref 12.0–15.0)
LYMPHS ABS: 2.4 10*3/uL (ref 0.7–4.0)
Lymphocytes Relative: 36 % (ref 12–46)
MCH: 27.4 pg (ref 26.0–34.0)
MCHC: 33.9 g/dL (ref 30.0–36.0)
MCV: 80.9 fL (ref 78.0–100.0)
MONOS PCT: 6 % (ref 3–12)
MPV: 11.1 fL (ref 8.6–12.4)
Monocytes Absolute: 0.4 10*3/uL (ref 0.1–1.0)
Neutro Abs: 3.7 10*3/uL (ref 1.7–7.7)
Neutrophils Relative %: 55 % (ref 43–77)
Platelets: 303 10*3/uL (ref 150–400)
RBC: 4.67 MIL/uL (ref 3.87–5.11)
RDW: 16.5 % — ABNORMAL HIGH (ref 11.5–15.5)
WBC: 6.7 10*3/uL (ref 4.0–10.5)

## 2015-06-14 MED ORDER — TRIAMCINOLONE ACETONIDE 0.5 % EX OINT: 1.0000 "application " | TOPICAL_OINTMENT | Freq: Two times a day (BID) | CUTANEOUS | Status: AC

## 2015-06-14 NOTE — Progress Notes (Signed)
Patient ID: Patricia Thornton, female   DOB: 1971/02/18, 44 y.o.   MRN: [id number]   Subjective:     Patricia Thornton is a 44 y.o. female here for a routine exam. 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  Current complaints: none.  Married, sexually active.  Is having low desire and arrousal.   Very infrequent periods, roughly 2 periods a year.  Husband had a vasectomy reversal.  Stopped breastfeeding about 2 years ago.     Desires general health exam, declines STD screening.  Declined nutrition consult.  For the HSDD, desires to try increased exercise, one on one time with spouse, massage and other non-medication interventions, declines sex therapy at this time.  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     Personal health questionnaire:  Is patient Ashkenazi Jewish, have a family history of breast and/or ovarian cancer: no Is there a family history of uterine cancer diagnosed at age < 21, gastrointestinal cancer, urinary tract cancer, family member who is a Field seismologist syndrome-associated carrier: no Is the patient overweight and hypertensive, family history of diabetes, personal history of gestational diabetes, preeclampsia or PCOS: no Is patient over 73, have PCOS,  family history of premature CHD under age 71, diabetes, smoke, have hypertension or peripheral artery disease:  Yes, Grandparents have had all.  Dad has heart disease.  At any time, has a partner hit, kicked or otherwise hurt or frightened you?: no Over the past 2 weeks, have you felt down, depressed or hopeless?: no Over the past 2 weeks, have you felt little interest or pleasure in doing things?:no   Gynecologic History No LMP recorded. Contraception: none Last Pap: unknown. Results were: several years ago had a repeat pap smear. Last mammogram: 2012. Results were: normal  Obstetric History OB History  Gravida Para Term Preterm AB SAB TAB Ectopic Multiple Living   6 3 3  0 3 3 0 0 0 3    # Outcome Date GA Lbr Len/2nd Weight Sex Delivery Anes PTL Lv  6 Term 02/11/13 [redacted]w[redacted]d 02:15 / 00:59 7 lb 0.7 oz (3.195 kg) F Vag-Spont EPI  Y     Comments: WNL  5 SAB 01/2012          4 SAB 2011          3 Term 2009 [redacted]w[redacted]d 12:00 7 lb 12 oz (3.515 kg) F Vag-Spont EPI  Y  2 SAB 2008 [redacted]w[redacted]d         1 Term 2006 [redacted]w[redacted]d 07:00 8 lb 6 oz (3.799 kg) M Vag-Spont   Y      Past Medical History  Diagnosis Date  . AMA (advanced maternal age) multigravida 17+   . Endometriosis   . PONV (postoperative nausea and vomiting)     Past Surgical History  Procedure Laterality Date  . Laparoscopy    . Appendectomy    . Tonsillectomy    . Breast reduction surgery       Current outpatient prescriptions:  .  aspirin-acetaminophen-caffeine (EXCEDRIN MIGRAINE) 250-250-65 MG per tablet, Take by mouth every 6 (six) hours as needed for headache., Disp: , Rfl:  .  triamcinolone ointment (KENALOG) 0.5 %, Apply 1 application topically 2 (two) times daily., Disp: 30 g, Rfl: 2 No Known Allergies  History  Substance Use Topics  . Smoking status: Never Smoker   . Smokeless tobacco: Never Used  . Alcohol Use: No  Family History  Problem Relation Age of Onset  . Other Neg Hx   . Diabetes Maternal Grandmother   . Cancer Maternal Grandmother   . Stroke Paternal Grandmother   . Cancer Paternal Grandfather       Review of Systems  Constitutional: negative for fatigue and weight loss Respiratory: negative for cough and wheezing Cardiovascular: negative for chest pain, fatigue and palpitations Gastrointestinal: negative for abdominal pain and change in bowel habits Musculoskeletal:negative for myalgias Neurological: negative for gait problems and tremors Behavioral/Psych: negative for abusive relationship, depression Endocrine: negative for temperature intolerance   Genitourinary:negative for abnormal menstrual periods, genital lesions, hot flashes, sexual problems and vaginal  discharge Integument/breast: negative for breast lump, breast tenderness, nipple discharge and skin lesion(s)    Objective:       BP 115/70 mmHg  Pulse 78  Temp(Src) 97.8 F (36.6 C)  Ht 5\' 5"  (1.651 m)  Wt 194 lb (87.998 kg)  BMI 32.28 kg/m2  LMP  General:   alert  Skin:   lower extremity lesions and irritation, small open sores from scratching legs bilaterally.  Also has on right leg: varicose vein.    Lungs:   clear to auscultation bilaterally  Heart:   regular rate and rhythm, S1, S2 normal, no murmur, click, rub or gallop  Breasts:   normal without suspicious masses, skin or nipple changes or axillary nodes, had had breast reduction  Abdomen:  normal findings: no organomegaly, soft, non-tender and no hernia obese  Pelvis:  External genitalia: normal general appearance Urinary system: urethral meatus normal and bladder without fullness, nontender Vaginal: normal without tenderness, induration or masses Cervix: normal appearance Adnexa: normal bimanual exam Uterus: anteverted and non-tender, normal size   Lab Review Urine pregnancy test Labs reviewed yes Radiologic studies reviewed yes  50% of 30 min visit spent on counseling and coordination of care.   Assessment:    Healthy female exam.   HSDD   Plan:    Education reviewed: depression evaluation, low fat, low cholesterol diet, safe sex/STD prevention, self breast exams, skin cancer screening and weight bearing exercise. Contraception: none. Mammogram ordered. Follow up in: 1 year.   Meds ordered this encounter  Medications  . aspirin-acetaminophen-caffeine (EXCEDRIN MIGRAINE) 250-250-65 MG per tablet    Sig: Take by mouth every 6 (six) hours as needed for headache.  . triamcinolone ointment (KENALOG) 0.5 %    Sig: Apply 1 application topically 2 (two) times daily.    Dispense:  30 g    Refill:  2   Orders Placed This Encounter  Procedures  . MM DIGITAL SCREENING BILATERAL    Standing Status: Future      Number of Occurrences:      Standing Expiration Date: 08/14/2016    Order Specific Question:  Reason for Exam (SYMPTOM  OR DIAGNOSIS REQUIRED)    Answer:  well woman exam, hx of breast reduction    Order Specific Question:  Is the patient pregnant?    Answer:  No    Order Specific Question:  Preferred imaging location?    Answer:  [facility]  . CBC with Differential/Platelet  . Comprehensive metabolic panel  . TSH  . Cholesterol, total  . Triglycerides  . HDL cholesterol  . Hemoglobin A1c   Declines Addyi at this time for HSDD.

## 2015-06-14 NOTE — Addendum Note (Signed)
Addended by: Lewie Loron D on: 06/14/2015 01:42 PM   Modules accepted: Orders

## 2015-06-15 LAB — COMPREHENSIVE METABOLIC PANEL
ALT: 9 U/L (ref 0–35)
AST: 19 U/L (ref 0–37)
Albumin: 3.9 g/dL (ref 3.5–5.2)
Alkaline Phosphatase: 63 U/L (ref 39–117)
BILIRUBIN TOTAL: 0.6 mg/dL (ref 0.2–1.2)
BUN: 12 mg/dL (ref 6–23)
CHLORIDE: 105 meq/L (ref 96–112)
CO2: 23 meq/L (ref 19–32)
Calcium: 9 mg/dL (ref 8.4–10.5)
Creat: 0.59 mg/dL (ref 0.50–1.10)
GLUCOSE: 84 mg/dL (ref 70–99)
POTASSIUM: 4.5 meq/L (ref 3.5–5.3)
SODIUM: 141 meq/L (ref 135–145)
TOTAL PROTEIN: 6.9 g/dL (ref 6.0–8.3)

## 2015-06-15 LAB — PAP IG AND HPV HIGH-RISK: HPV DNA HIGH RISK: NOT DETECTED

## 2015-06-15 LAB — CHOLESTEROL, TOTAL: CHOLESTEROL: 178 mg/dL (ref 0–200)

## 2015-06-15 LAB — HDL CHOLESTEROL: HDL: 48 mg/dL (ref >=46)

## 2015-06-15 LAB — HEMOGLOBIN A1C
HEMOGLOBIN A1C: 5.8 % — AB (ref <5.7)
MEAN PLASMA GLUCOSE: 120 mg/dL — AB (ref <117)

## 2015-06-15 LAB — TRIGLYCERIDES: TRIGLYCERIDES: 87 mg/dL (ref <150)

## 2015-06-15 LAB — TSH: TSH: 1.397 u[IU]/mL (ref 0.350–4.500)

## 2015-06-17 LAB — SURESWAB BACTERIAL VAGINOSIS/ITIS
Atopobium vaginae: NOT DETECTED Log (cells/mL)
C. GLABRATA, DNA: NOT DETECTED
C. PARAPSILOSIS, DNA: NOT DETECTED
C. albicans, DNA: NOT DETECTED
C. tropicalis, DNA: NOT DETECTED
Gardnerella vaginalis: NOT DETECTED Log (cells/mL)
LACTOBACILLUS SPECIES: NOT DETECTED Log (cells/mL)
MEGASPHAERA SPECIES: NOT DETECTED Log (cells/mL)
T. vaginalis RNA, QL TMA: NOT DETECTED

## 2015-06-30 ENCOUNTER — Ambulatory Visit (HOSPITAL_COMMUNITY)
Admission: RE | Admit: 2015-06-30 | Discharge: 2015-06-30 | Disposition: A | Discharge status: Home / Self Care | Payer: BLUE CROSS/BLUE SHIELD | Source: Ambulatory Visit | Attending: Certified Nurse Midwife | Admitting: Certified Nurse Midwife

## 2015-06-30 DIAGNOSIS — Z01419 Encounter for gynecological examination (general) (routine) without abnormal findings: Secondary | ICD-10-CM

## 2015-06-30 DIAGNOSIS — Z1231 Encounter for screening mammogram for malignant neoplasm of breast: Secondary | ICD-10-CM | POA: Diagnosis present

## 2016-06-18 ENCOUNTER — Encounter: Payer: Self-pay | Admitting: Certified Nurse Midwife

## 2016-06-18 ENCOUNTER — Ambulatory Visit (INDEPENDENT_AMBULATORY_CARE_PROVIDER_SITE_OTHER): Payer: BLUE CROSS/BLUE SHIELD | Admitting: Certified Nurse Midwife

## 2016-06-18 VITALS — BP 112/74 | HR 75 | Temp 97.9°F | Wt 200.0 lb

## 2016-06-18 DIAGNOSIS — Z0000 Encounter for general adult medical examination without abnormal findings: Secondary | ICD-10-CM | POA: Insufficient documentation

## 2016-06-18 DIAGNOSIS — Z01419 Encounter for gynecological examination (general) (routine) without abnormal findings: Secondary | ICD-10-CM

## 2016-06-18 DIAGNOSIS — Z1389 Encounter for screening for other disorder: Secondary | ICD-10-CM

## 2016-06-18 DIAGNOSIS — N912 Amenorrhea, unspecified: Secondary | ICD-10-CM

## 2016-06-18 LAB — POCT URINALYSIS DIPSTICK
BILIRUBIN UA: NEGATIVE
GLUCOSE UA: NEGATIVE
Ketones, UA: NEGATIVE
Leukocytes, UA: NEGATIVE
NITRITE UA: NEGATIVE
Protein, UA: NEGATIVE
RBC UA: NEGATIVE
SPEC GRAV UA: 1.02
UROBILINOGEN UA: NEGATIVE
pH, UA: 5

## 2016-06-18 NOTE — Progress Notes (Signed)
Patient ID: Patricia Thornton, female   DOB: 1971/01/26, 45 y.o.   MRN: [id number]   Subjective:        Patricia Thornton is a 45 y.o. female here for a routine exam.  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 Last child was an adopted embryo Current complaints: none. Married, sexually active. Is having low desire and arrousal. Very infrequent periods, LMP well before christmas. Husband had a vasectomy reversal. Desires general health exam, declines STD screening. Declined nutrition consult.  Walking periodically.  For the HSDD, desires to try increased exercise, one on one time with spouse, massage and other non-medication interventions, declines sex therapy at this time. Has been seeing dermatologist.  Requesting FSH, LH, Estrogen & general blood work.  Personal health questionnaire:  Is patient Ashkenazi Jewish, have a family history of breast and/or ovarian cancer: no Is there a family history of uterine cancer diagnosed at age < 22, gastrointestinal cancer, urinary tract cancer, family member who is a Field seismologist syndrome-associated carrier: no Is the patient overweight and hypertensive, family history of diabetes, personal history of gestational diabetes, preeclampsia or PCOS: no Is patient over 24, have PCOS, family history of premature CHD under age 67, diabetes, smoke, have hypertension or peripheral artery disease: Yes, Grandparents have had all. Dad has heart disease.  At any time, has a partner hit, kicked or otherwise hurt or frightened you?: no Over the past 2 weeks, have you felt down, depressed or hopeless?: no Over the past 2 weeks, have you felt little interest or pleasure in doing things?:no  Gynecologic History Patient's last menstrual period was 05/03/2014. Contraception: none Last Pap: 06-14-2015. Results were: normal Last mammogram: 06-30-2015. Results were: normal  Obstetric History OB History  Gravida Para Term Preterm AB SAB TAB  Ectopic Multiple Living  6 3 3  0 3 3 0 0 0 3    # Outcome Date GA Lbr Len/2nd Weight Sex Delivery Anes PTL Lv  6 Term 02/11/13 [redacted]w[redacted]d 02:15 / 00:59 7 lb 0.7 oz (3.195 kg) F Vag-Spont EPI  Y     Comments: WNL  5 SAB 01/2012          4 SAB 2011          3 Term 2009 [redacted]w[redacted]d 12:00 7 lb 12 oz (3.515 kg) F Vag-Spont EPI  Y  2 SAB 2008 [redacted]w[redacted]d         1 Term 2006 [redacted]w[redacted]d 07:00 8 lb 6 oz (3.799 kg) M Vag-Spont   Y      Past Medical History  Diagnosis Date  . AMA (advanced maternal age) multigravida 88+   . Endometriosis   . PONV (postoperative nausea and vomiting)     Past Surgical History  Procedure Laterality Date  . Laparoscopy    . Appendectomy    . Tonsillectomy    . Breast reduction surgery       Current outpatient prescriptions:  .  aspirin-acetaminophen-caffeine (EXCEDRIN MIGRAINE) 250-250-65 MG per tablet, Take by mouth every 6 (six) hours as needed for headache., Disp: , Rfl:  .  triamcinolone ointment (KENALOG) 0.5 %, Apply 1 application topically 2 (two) times daily., Disp: 30 g, Rfl: 2 No Known Allergies  Social History  Substance Use Topics  . Smoking status: Never Smoker   . Smokeless tobacco: Never Used  . Alcohol Use: No    Family History  Problem Relation Age of Onset  . Other Neg Hx   .  Diabetes Maternal Grandmother   . Cancer Maternal Grandmother   . Stroke Paternal Grandmother   . Cancer Paternal Grandfather       Review of Systems  Constitutional: negative for fatigue and weight loss Respiratory: negative for cough and wheezing Cardiovascular: negative for chest pain, fatigue and palpitations Gastrointestinal: negative for abdominal pain and change in bowel habits Musculoskeletal:negative for myalgias Neurological: negative for gait problems and tremors Behavioral/Psych: negative for abusive relationship, depression Endocrine: negative for temperature intolerance   Genitourinary:negative for abnormal menstrual periods, genital lesions, hot flashes,  sexual problems and vaginal discharge Integument/breast: negative for breast lump, breast tenderness, nipple discharge and skin lesion(s)    Objective:       BP 112/74 mmHg  Pulse 75  Temp(Src) 97.9 F (36.6 C)  Wt 200 lb (90.719 kg)  LMP 05/03/2014 General:   alert  Skin:   no rash or abnormalities  Lungs:   clear to auscultation bilaterally  Heart:   regular rate and rhythm, S1, S2 normal, no murmur, click, rub or gallop  Breasts:   normal without suspicious masses, skin or nipple changes or axillary nodes  Abdomen:  normal findings: no organomegaly, soft, non-tender and no hernia  Pelvis:  External genitalia: normal general appearance Urinary system: urethral meatus normal and bladder without fullness, nontender Vaginal: normal without tenderness, induration or masses Cervix: normal appearance Adnexa: normal bimanual exam Uterus: anteverted and non-tender, normal size   Lab Review Urine pregnancy test Labs reviewed yes Radiologic studies reviewed yes  50% of 30 min visit spent on counseling and coordination of care.   Assessment:    Healthy female exam.   Doing well. Ammenorrhea. Hx of bilateral breast reduction surgery in 1990s. Nutrition & exercise counseling.   Plan:    Education reviewed: depression evaluation, low fat, low cholesterol diet, self breast exams, skin cancer screening and weight bearing exercise. Contraception: none. Mammogram ordered. Follow up in: 1 year.   No orders of the defined types were placed in this encounter.   Orders Placed This Encounter  Procedures  . MM DIGITAL SCREENING BILATERAL    Standing Status: Future     Number of Occurrences:      Standing Expiration Date: 08/19/2017    Order Specific Question:  Reason for Exam (SYMPTOM  OR DIAGNOSIS REQUIRED)    Answer:  annual exam    Order Specific Question:  Is the patient pregnant?    Answer:  No    Order Specific Question:  Preferred imaging location?    Answer:  [facility]  . CBC with Differential/Platelet  . Comprehensive metabolic panel  . TSH  . Cholesterol, total  . Triglycerides  . HDL cholesterol  . FSH/LH  . Estrogens, total  . Hemoglobin A1c  . POCT urinalysis dipstick   Need to obtain previous records Possible management options include: diet & exercise plan, nutrition consult. Follow up as needed. Follow up in 1 year.

## 2016-06-20 LAB — TSH: TSH: 1.53 u[IU]/mL (ref 0.450–4.500)

## 2016-06-20 LAB — COMPREHENSIVE METABOLIC PANEL
ALK PHOS: 72 IU/L (ref 39–117)
ALT: 8 IU/L (ref 0–32)
AST: 14 IU/L (ref 0–40)
Albumin/Globulin Ratio: 1.4 (ref 1.2–2.2)
Albumin: 4.1 g/dL (ref 3.5–5.5)
BUN/Creatinine Ratio: 23 (ref 9–23)
BUN: 15 mg/dL (ref 6–24)
Bilirubin Total: 0.6 mg/dL (ref 0.0–1.2)
CO2: 22 mmol/L (ref 18–29)
Calcium: 9.5 mg/dL (ref 8.7–10.2)
Chloride: 105 mmol/L (ref 96–106)
Creatinine, Ser: 0.66 mg/dL (ref 0.57–1.00)
GFR calc Af Amer: 123 mL/min/{1.73_m2} (ref >59)
GFR, EST NON AFRICAN AMERICAN: 107 mL/min/{1.73_m2} (ref >59)
Globulin, Total: 3 g/dL (ref 1.5–4.5)
Glucose: 91 mg/dL (ref 65–99)
POTASSIUM: 4.3 mmol/L (ref 3.5–5.2)
Sodium: 141 mmol/L (ref 134–144)
Total Protein: 7.1 g/dL (ref 6.0–8.5)

## 2016-06-20 LAB — CHOLESTEROL, TOTAL: CHOLESTEROL TOTAL: 168 mg/dL (ref 100–199)

## 2016-06-20 LAB — CBC WITH DIFFERENTIAL/PLATELET
BASOS: 1 %
Basophils Absolute: 0.1 10*3/uL (ref 0.0–0.2)
EOS (ABSOLUTE): 0.1 10*3/uL (ref 0.0–0.4)
Eos: 2 %
Hematocrit: 41 % (ref 34.0–46.6)
Hemoglobin: 13.1 g/dL (ref 11.1–15.9)
Immature Grans (Abs): 0 10*3/uL (ref 0.0–0.1)
Immature Granulocytes: 0 %
LYMPHS ABS: 2.1 10*3/uL (ref 0.7–3.1)
Lymphs: 33 %
MCH: 27.4 pg (ref 26.6–33.0)
MCHC: 32 g/dL (ref 31.5–35.7)
MCV: 86 fL (ref 79–97)
MONOCYTES: 6 %
Monocytes Absolute: 0.4 10*3/uL (ref 0.1–0.9)
NEUTROS ABS: 3.6 10*3/uL (ref 1.4–7.0)
Neutrophils: 58 %
Platelets: 326 10*3/uL (ref 150–379)
RBC: 4.78 x10E6/uL (ref 3.77–5.28)
RDW: 14.1 % (ref 12.3–15.4)
WBC: 6.3 10*3/uL (ref 3.4–10.8)

## 2016-06-20 LAB — FSH/LH
FSH: 54.2 m[IU]/mL
LH: 28.7 m[IU]/mL

## 2016-06-20 LAB — ESTROGENS, TOTAL: Estrogen: 34 pg/mL

## 2016-06-20 LAB — TRIGLYCERIDES: TRIGLYCERIDES: 94 mg/dL (ref 0–149)

## 2016-06-20 LAB — HEMOGLOBIN A1C
ESTIMATED AVERAGE GLUCOSE: 111 mg/dL
Hgb A1c MFr Bld: 5.5 % (ref 4.8–5.6)

## 2016-06-20 LAB — HDL CHOLESTEROL: HDL: 48 mg/dL (ref >39)

## 2016-06-21 LAB — PAP IG AND HPV HIGH-RISK
HPV, high-risk: NEGATIVE
PAP Smear Comment: 0

## 2016-06-22 LAB — NUSWAB VG, CANDIDA 6SP
CANDIDA KRUSEI, NAA: NEGATIVE
CANDIDA TROPICALIS, NAA: NEGATIVE
Candida albicans, NAA: NEGATIVE
Candida glabrata, NAA: NEGATIVE
Candida lusitaniae, NAA: NEGATIVE
Candida parapsilosis, NAA: NEGATIVE
Trich vag by NAA: NEGATIVE

## 2016-06-25 ENCOUNTER — Other Ambulatory Visit: Payer: Self-pay | Admitting: Certified Nurse Midwife

## 2016-06-25 DIAGNOSIS — Z1231 Encounter for screening mammogram for malignant neoplasm of breast: Secondary | ICD-10-CM

## 2016-07-03 ENCOUNTER — Encounter: Payer: Self-pay | Admitting: *Deleted

## 2016-07-11 ENCOUNTER — Ambulatory Visit
Admission: RE | Admit: 2016-07-11 | Discharge: 2016-07-11 | Disposition: A | Discharge status: Home / Self Care | Payer: BLUE CROSS/BLUE SHIELD | Source: Ambulatory Visit | Attending: Certified Nurse Midwife | Admitting: Certified Nurse Midwife

## 2016-07-11 DIAGNOSIS — Z1231 Encounter for screening mammogram for malignant neoplasm of breast: Secondary | ICD-10-CM

## 2016-11-12 DIAGNOSIS — Z85828 Personal history of other malignant neoplasm of skin: Secondary | ICD-10-CM | POA: Diagnosis not present

## 2016-11-12 DIAGNOSIS — L814 Other melanin hyperpigmentation: Secondary | ICD-10-CM | POA: Diagnosis not present

## 2016-11-12 DIAGNOSIS — L821 Other seborrheic keratosis: Secondary | ICD-10-CM | POA: Diagnosis not present

## 2016-11-12 DIAGNOSIS — D1801 Hemangioma of skin and subcutaneous tissue: Secondary | ICD-10-CM | POA: Diagnosis not present

## 2016-11-18 ENCOUNTER — Ambulatory Visit (INDEPENDENT_AMBULATORY_CARE_PROVIDER_SITE_OTHER): Payer: BLUE CROSS/BLUE SHIELD | Admitting: Orthopaedic Surgery

## 2016-12-17 ENCOUNTER — Ambulatory Visit (INDEPENDENT_AMBULATORY_CARE_PROVIDER_SITE_OTHER): Payer: BLUE CROSS/BLUE SHIELD | Admitting: Orthopaedic Surgery

## 2017-04-18 DIAGNOSIS — H5310 Unspecified subjective visual disturbances: Secondary | ICD-10-CM | POA: Diagnosis not present

## 2017-05-23 DIAGNOSIS — M65341 Trigger finger, right ring finger: Secondary | ICD-10-CM | POA: Diagnosis not present

## 2017-05-23 DIAGNOSIS — M79641 Pain in right hand: Secondary | ICD-10-CM | POA: Diagnosis not present

## 2017-05-23 DIAGNOSIS — M79642 Pain in left hand: Secondary | ICD-10-CM | POA: Diagnosis not present

## 2017-06-19 ENCOUNTER — Encounter: Payer: Self-pay | Admitting: Certified Nurse Midwife

## 2017-06-19 ENCOUNTER — Ambulatory Visit (INDEPENDENT_AMBULATORY_CARE_PROVIDER_SITE_OTHER): Payer: BLUE CROSS/BLUE SHIELD | Admitting: Certified Nurse Midwife

## 2017-06-19 VITALS — BP 119/79 | HR 84 | Wt 206.4 lb

## 2017-06-19 DIAGNOSIS — IMO0001 Reserved for inherently not codable concepts without codable children: Secondary | ICD-10-CM

## 2017-06-19 DIAGNOSIS — Z01419 Encounter for gynecological examination (general) (routine) without abnormal findings: Secondary | ICD-10-CM

## 2017-06-19 DIAGNOSIS — R7303 Prediabetes: Secondary | ICD-10-CM | POA: Diagnosis not present

## 2017-06-19 DIAGNOSIS — E559 Vitamin D deficiency, unspecified: Secondary | ICD-10-CM | POA: Diagnosis not present

## 2017-06-19 DIAGNOSIS — E669 Obesity, unspecified: Secondary | ICD-10-CM | POA: Diagnosis not present

## 2017-06-19 DIAGNOSIS — N950 Postmenopausal bleeding: Secondary | ICD-10-CM

## 2017-06-19 MED ORDER — PHENTERMINE HCL 37.5 MG PO CAPS: 37.5000 mg | ORAL_CAPSULE | ORAL | 3 refills | Status: DC

## 2017-06-19 NOTE — Progress Notes (Signed)
Patient is in the office for her annual exam- she is concerned about her weight. Patient reports she had a light cycle lasting  4 days- less than 1 month ago.

## 2017-06-19 NOTE — Progress Notes (Signed)
Subjective:        Patricia Thornton is a 46 y.o. female here for a routine exam.  Current complaints: reports post menopausal bleeding a few months ago, period like, nothing since.  Has 3 children, was wanting more previously.  Has gained 6 lbs this year.  Prediabetes in her history.    Declines STD screening, married. Is currently sexually active.  Desires treatment for obesity.  Has a hx of yoyo dieting.    Personal health questionnaire:  Is patient Ashkenazi Jewish, have a family history of breast and/or ovarian cancer: no Is there a family history of uterine cancer diagnosed at age < 36, gastrointestinal cancer, urinary tract cancer, family member who is a Field seismologist syndrome-associated carrier: no Is the patient overweight and hypertensive, family history of diabetes, personal history of gestational diabetes, preeclampsia or PCOS: no Is patient over 48, have PCOS, family history of premature CHD under age 39, diabetes, smoke, have hypertension or peripheral artery disease: Yes, Grandparents have had all. Dad has heart disease.  At any time, has a partner hit, kicked or otherwise hurt or frightened you?: no Over the past 2 weeks, have you felt down, depressed or hopeless?: no Over the past 2 weeks, have you felt little interest or pleasure in doing things?:no   Gynecologic History Patient's last menstrual period was 05/03/2014. Contraception: none Last Pap: 06/13/16. Results were: normal Last mammogram: 07/11/16. Results were: normal  Obstetric History OB History  Gravida Para Term Preterm AB Living  6 3 3  0 3 3  SAB TAB Ectopic Multiple Live Births  3 0 0 0 3    # Outcome Date GA Lbr Len/2nd Weight Sex Delivery Anes PTL Lv  6 Term 02/11/13 [redacted]w[redacted]d 02:15 / 00:59 7 lb 0.7 oz (3.195 kg) F Vag-Spont EPI  LIV     Birth Comments: WNL  5 SAB 01/2012          4 SAB 2011          3 Term 2009 [redacted]w[redacted]d 12:00 7 lb 12 oz (3.515 kg) F Vag-Spont EPI  LIV  2 SAB 2008 [redacted]w[redacted]d         1 Term 2006 [redacted]w[redacted]d  07:00 8 lb 6 oz (3.799 kg) M Vag-Spont   LIV      Past Medical History:  Diagnosis Date  . AMA (advanced maternal age) multigravida 32+   . Endometriosis   . PONV (postoperative nausea and vomiting)     Past Surgical History:  Procedure Laterality Date  . APPENDECTOMY    . BREAST REDUCTION SURGERY    . LAPAROSCOPY    . TONSILLECTOMY       Current Outpatient Prescriptions:  .  aspirin-acetaminophen-caffeine (EXCEDRIN MIGRAINE) 694-503-88 MG per tablet, Take by mouth every 6 (six) hours as needed for headache., Disp: , Rfl:  .  Green Tea, Camillia sinensis, 250 MG CAPS, Take by mouth., Disp: , Rfl:  .  triamcinolone ointment (KENALOG) 0.5 %, Apply 1 application topically 2 (two) times daily. (Patient not taking: Reported on 06/19/2017), Disp: 30 g, Rfl: 2 No Known Allergies  Social History  Substance Use Topics  . Smoking status: Never Smoker  . Smokeless tobacco: Never Used  . Alcohol use No    Family History  Problem Relation Age of Onset  . Diabetes Maternal Grandmother   . Cancer Maternal Grandmother   . Stroke Paternal Grandmother   . Cancer Paternal Grandfather   . Heart disease Mother  afib  . Other Neg Hx       Review of Systems  Constitutional: negative for fatigue and weight loss Respiratory: negative for cough and wheezing Cardiovascular: negative for chest pain, fatigue and palpitations Gastrointestinal: negative for abdominal pain and change in bowel habits Musculoskeletal:negative for myalgias Neurological: negative for gait problems and tremors Behavioral/Psych: negative for abusive relationship, depression Endocrine: negative for temperature intolerance    Genitourinary:negative for abnormal menstrual periods, genital lesions, hot flashes, sexual problems and vaginal discharge Integument/breast: negative for breast lump, breast tenderness, nipple discharge and skin lesion(s)    Objective:       BP 119/79   Pulse 84   Wt 206 lb 6.4 oz  (93.6 kg)   LMP 05/03/2014   BMI 34.35 kg/m  General:   alert  Skin:   no rash or abnormalities  Lungs:   clear to auscultation bilaterally  Heart:   regular rate and rhythm, S1, S2 normal, no murmur, click, rub or gallop  Breasts:   normal without suspicious masses, skin or nipple changes or axillary nodes  Abdomen:  normal findings: no organomegaly, soft, non-tender and no hernia obese  Pelvis:  External genitalia: normal general appearance Urinary system: urethral meatus normal and bladder without fullness, nontender Vaginal: normal without tenderness, induration or masses Cervix: normal appearance, ?polyp in internal os   Adnexa: normal bimanual exam Uterus: anteverted and non-tender, normal size   Lab Review Urine pregnancy test Labs reviewed yes Radiologic studies reviewed yes  50% of 30 min visit spent on counseling and coordination of care.    Assessment & Plan    Healthy female exam.   1. Well woman exam with routine gynecological exam      Vitamin samples given.  - Cytology - PAP - MM DIGITAL SCREENING BILATERAL; Future - CBC with Differential/Platelet - Comprehensive metabolic panel - TSH - Cholesterol, total - Triglycerides - HDL cholesterol - VITAMIN D 25 Hydroxy (Vit-D Deficiency, Fractures)  2. Class 3 obesity without serious comorbidity in adult, unspecified BMI, unspecified obesity type    - Hemoglobin A1c - phentermine 37.5 MG capsule; Take 1 capsule (37.5 mg total) by mouth every morning.  Dispense: 30 capsule; Refill: 3  3. Prediabetes     - Hemoglobin A1c - phentermine 37.5 MG capsule; Take 1 capsule (37.5 mg total) by mouth every morning.  Dispense: 30 capsule; Refill: 3  4. Vitamin D deficiency    - VITAMIN D 25 Hydroxy (Vit-D Deficiency, Fractures)  5. Post-menopausal bleeding    - US Pelvis Complete; Future - US Transvaginal Non-OB; Future  6. Endocervical polyp probable.    Education reviewed: calcium supplements, depression  evaluation, low fat, low cholesterol diet, safe sex/STD prevention, self breast exams, skin cancer screening and weight bearing exercise. Contraception: post menopausal status. Mammogram ordered. Follow up in: 1 year. and after [place] for postmenopausal bleeding for endometrial biopsy  Meds ordered this encounter  Medications  . Green Tea, Camillia sinensis, 250 MG CAPS    Sig: Take by mouth.   No orders of the defined types were placed in this encounter.  Possible management options include: endometrial biopsy Follow up as needed.

## 2017-06-20 ENCOUNTER — Other Ambulatory Visit: Payer: Self-pay | Admitting: Certified Nurse Midwife

## 2017-06-20 DIAGNOSIS — E559 Vitamin D deficiency, unspecified: Secondary | ICD-10-CM

## 2017-06-20 LAB — CBC WITH DIFFERENTIAL/PLATELET
BASOS ABS: 0 10*3/uL (ref 0.0–0.2)
BASOS: 0 %
EOS (ABSOLUTE): 0.1 10*3/uL (ref 0.0–0.4)
Eos: 2 %
Hematocrit: 41 % (ref 34.0–46.6)
Hemoglobin: 13.4 g/dL (ref 11.1–15.9)
IMMATURE GRANS (ABS): 0 10*3/uL (ref 0.0–0.1)
Immature Granulocytes: 0 %
LYMPHS ABS: 2.4 10*3/uL (ref 0.7–3.1)
Lymphs: 35 %
MCH: 28.5 pg (ref 26.6–33.0)
MCHC: 32.7 g/dL (ref 31.5–35.7)
MCV: 87 fL (ref 79–97)
MONOS ABS: 0.6 10*3/uL (ref 0.1–0.9)
Monocytes: 9 %
NEUTROS ABS: 3.6 10*3/uL (ref 1.4–7.0)
Neutrophils: 54 %
PLATELETS: 319 10*3/uL (ref 150–379)
RBC: 4.71 x10E6/uL (ref 3.77–5.28)
RDW: 14.2 % (ref 12.3–15.4)
WBC: 6.8 10*3/uL (ref 3.4–10.8)

## 2017-06-20 LAB — COMPREHENSIVE METABOLIC PANEL
A/G RATIO: 1.7 (ref 1.2–2.2)
ALK PHOS: 81 IU/L (ref 39–117)
ALT: 15 IU/L (ref 0–32)
AST: 19 IU/L (ref 0–40)
Albumin: 4.2 g/dL (ref 3.5–5.5)
BILIRUBIN TOTAL: 0.4 mg/dL (ref 0.0–1.2)
BUN/Creatinine Ratio: 17 (ref 9–23)
BUN: 10 mg/dL (ref 6–24)
CHLORIDE: 105 mmol/L (ref 96–106)
CO2: 21 mmol/L (ref 20–29)
Calcium: 9.4 mg/dL (ref 8.7–10.2)
Creatinine, Ser: 0.6 mg/dL (ref 0.57–1.00)
GFR calc non Af Amer: 110 mL/min/{1.73_m2} (ref >59)
GFR, EST AFRICAN AMERICAN: 126 mL/min/{1.73_m2} (ref >59)
Globulin, Total: 2.5 g/dL (ref 1.5–4.5)
Glucose: 95 mg/dL (ref 65–99)
POTASSIUM: 4.3 mmol/L (ref 3.5–5.2)
Sodium: 142 mmol/L (ref 134–144)
TOTAL PROTEIN: 6.7 g/dL (ref 6.0–8.5)

## 2017-06-20 LAB — TRIGLYCERIDES: TRIGLYCERIDES: 69 mg/dL (ref 0–149)

## 2017-06-20 LAB — HEMOGLOBIN A1C
ESTIMATED AVERAGE GLUCOSE: 111 mg/dL
HEMOGLOBIN A1C: 5.5 % (ref 4.8–5.6)

## 2017-06-20 LAB — TSH: TSH: 1.71 u[IU]/mL (ref 0.450–4.500)

## 2017-06-20 LAB — VITAMIN D 25 HYDROXY (VIT D DEFICIENCY, FRACTURES): Vit D, 25-Hydroxy: 26 ng/mL — ABNORMAL LOW (ref 30.0–100.0)

## 2017-06-20 LAB — CHOLESTEROL, TOTAL: Cholesterol, Total: 138 mg/dL (ref 100–199)

## 2017-06-20 LAB — HDL CHOLESTEROL: HDL: 43 mg/dL (ref >39)

## 2017-06-20 MED ORDER — VITAMIN D (ERGOCALCIFEROL) 1.25 MG (50000 UNIT) PO CAPS: 50000.0000 [IU] | ORAL_CAPSULE | ORAL | 2 refills | Status: DC

## 2017-06-23 ENCOUNTER — Other Ambulatory Visit: Payer: Self-pay | Admitting: Certified Nurse Midwife

## 2017-06-23 DIAGNOSIS — Z1231 Encounter for screening mammogram for malignant neoplasm of breast: Secondary | ICD-10-CM

## 2017-06-23 LAB — CYTOLOGY - PAP
Diagnosis: NEGATIVE
HPV (WINDOPATH): NOT DETECTED

## 2017-06-30 ENCOUNTER — Ambulatory Visit (HOSPITAL_COMMUNITY)
Admission: RE | Admit: 2017-06-30 | Discharge: 2017-06-30 | Disposition: A | Discharge status: Home / Self Care | Payer: BLUE CROSS/BLUE SHIELD | Source: Ambulatory Visit | Attending: Certified Nurse Midwife | Admitting: Certified Nurse Midwife

## 2017-06-30 DIAGNOSIS — N950 Postmenopausal bleeding: Secondary | ICD-10-CM | POA: Insufficient documentation

## 2017-07-08 ENCOUNTER — Other Ambulatory Visit (HOSPITAL_COMMUNITY)
Admission: RE | Admit: 2017-07-08 | Discharge: 2017-07-08 | Disposition: A | Discharge status: Home / Self Care | Payer: BLUE CROSS/BLUE SHIELD | Source: Ambulatory Visit | Attending: Certified Nurse Midwife | Admitting: Certified Nurse Midwife

## 2017-07-08 ENCOUNTER — Ambulatory Visit (INDEPENDENT_AMBULATORY_CARE_PROVIDER_SITE_OTHER): Payer: BLUE CROSS/BLUE SHIELD | Admitting: Certified Nurse Midwife

## 2017-07-08 ENCOUNTER — Encounter: Payer: Self-pay | Admitting: Certified Nurse Midwife

## 2017-07-08 VITALS — BP 116/72 | HR 89 | Ht 65.0 in | Wt 198.8 lb

## 2017-07-08 DIAGNOSIS — N950 Postmenopausal bleeding: Secondary | ICD-10-CM

## 2017-07-08 NOTE — Progress Notes (Signed)
Patient presents for Endometrial Biopsy.  Last post menopausal bleeding 2-3 months ago.

## 2017-07-09 ENCOUNTER — Encounter: Payer: Self-pay | Admitting: Certified Nurse Midwife

## 2017-07-09 NOTE — Progress Notes (Signed)
Patient ID: Patricia Thornton, female   DOB: [birth year], 46 y.o.   MRN: [id number]  Chief Complaint  Patient presents with  . Follow-up    Endometrial BX    HPI Patricia Thornton is a 46 y.o. female.  Patient here for endometrial biopsy for postmenopausal bleeding.  Had been period free for over 12 months until recently and then reported a period like bleeding off and on for 2 months.  Had pelvic US performed and they were normal.    HPI  Past Medical History:  Diagnosis Date  . AMA (advanced maternal age) multigravida 1+   . Endometriosis   . PONV (postoperative nausea and vomiting)     Past Surgical History:  Procedure Laterality Date  . APPENDECTOMY    . BREAST REDUCTION SURGERY    . LAPAROSCOPY    . TONSILLECTOMY      Family History  Problem Relation Age of Onset  . Diabetes Maternal Grandmother   . Cancer Maternal Grandmother   . Stroke Paternal Grandmother   . Cancer Paternal Grandfather   . Heart disease Mother        afib  . Other Neg Hx     Social History Social History  Substance Use Topics  . Smoking status: Never Smoker  . Smokeless tobacco: Never Used  . Alcohol use No    No Known Allergies  Current Outpatient Prescriptions  Medication Sig Dispense Refill  . aspirin-acetaminophen-caffeine (EXCEDRIN MIGRAINE) 250-250-65 MG per tablet Take by mouth every 6 (six) hours as needed for headache.    . phentermine 37.5 MG capsule Take 1 capsule (37.5 mg total) by mouth every morning. 30 capsule 3  . triamcinolone ointment (KENALOG) 0.5 % Apply 1 application topically 2 (two) times daily. 30 g 2  . Vitamin D, Ergocalciferol, (DRISDOL) 50000 units CAPS capsule Take 1 capsule (50,000 Units total) by mouth every 7 (seven) days. 30 capsule 2  . Green Tea, Camillia sinensis, 250 MG CAPS Take by mouth.     No current facility-administered medications for this visit.     Review of Systems Review of Systems Constitutional: negative for fatigue and weight  loss Respiratory: negative for cough and wheezing Cardiovascular: negative for chest pain, fatigue and palpitations Gastrointestinal: negative for abdominal pain and change in bowel habits Genitourinary:negative Integument/breast: negative for nipple discharge Musculoskeletal:negative for myalgias Neurological: negative for gait problems and tremors Behavioral/Psych: negative for abusive relationship, depression Endocrine: negative for temperature intolerance      Blood pressure 116/72, pulse 89, height 5\' 5"  (1.651 m), weight 198 lb 12.8 oz (90.2 kg), last menstrual period 05/03/2014.  Physical Exam Physical Exam General:   alert  Abdomen:  normal findings: no organomegaly, soft, non-tender and no hernia  Pelvis:  External genitalia: normal general appearance Urinary system: urethral meatus normal and bladder without fullness, nontender Vaginal: normal without tenderness, induration or masses Cervix: normal appearance Adnexa: normal bimanual exam Uterus: anteverted and non-tender, normal size    Procedure note:  Patient given informed consent, signed copy in the chart, time out was performed. Appropriate time out taken. . The patient was placed in the lithotomy position and the cervix brought into view with sterile speculum.  Portio of cervix cleansed x 2 with betadine swabs.  A tenaculum was placed in the anterior lip of the cervix.  The uterus was sounded for depth of 9 cm. A pipelle was introduced to into the uterus, suction created,  and an endometrial sample was obtained. All equipment was  removed and accounted for.  The patient tolerated the procedure well.    Patient given post procedure instructions. The patient will return in 2 weeks for results.  50% of 20 min visit spent on counseling and coordination of care.    Data Reviewed Previous medical hx, meds, labs, [place] results  Assessment      Postmenopausal bleeding with no known cause most likely benign bleeding.      Plan  F/U with endometrial biopsy results.

## 2017-07-15 ENCOUNTER — Ambulatory Visit
Admission: RE | Admit: 2017-07-15 | Discharge: 2017-07-15 | Disposition: A | Discharge status: Home / Self Care | Payer: BLUE CROSS/BLUE SHIELD | Source: Ambulatory Visit | Attending: Certified Nurse Midwife | Admitting: Certified Nurse Midwife

## 2017-07-15 DIAGNOSIS — Z1231 Encounter for screening mammogram for malignant neoplasm of breast: Secondary | ICD-10-CM | POA: Diagnosis not present

## 2017-10-13 ENCOUNTER — Encounter: Payer: Self-pay | Admitting: *Deleted

## 2017-12-16 DIAGNOSIS — D1801 Hemangioma of skin and subcutaneous tissue: Secondary | ICD-10-CM | POA: Diagnosis not present

## 2017-12-16 DIAGNOSIS — L814 Other melanin hyperpigmentation: Secondary | ICD-10-CM | POA: Diagnosis not present

## 2017-12-16 DIAGNOSIS — Z85828 Personal history of other malignant neoplasm of skin: Secondary | ICD-10-CM | POA: Diagnosis not present

## 2017-12-16 DIAGNOSIS — L821 Other seborrheic keratosis: Secondary | ICD-10-CM | POA: Diagnosis not present

## 2018-02-05 DIAGNOSIS — G43109 Migraine with aura, not intractable, without status migrainosus: Secondary | ICD-10-CM | POA: Diagnosis not present

## 2018-03-11 ENCOUNTER — Ambulatory Visit (INDEPENDENT_AMBULATORY_CARE_PROVIDER_SITE_OTHER): Payer: BLUE CROSS/BLUE SHIELD | Admitting: Orthopaedic Surgery

## 2018-03-11 ENCOUNTER — Encounter (INDEPENDENT_AMBULATORY_CARE_PROVIDER_SITE_OTHER): Payer: Self-pay | Admitting: Orthopaedic Surgery

## 2018-03-11 ENCOUNTER — Ambulatory Visit (INDEPENDENT_AMBULATORY_CARE_PROVIDER_SITE_OTHER): Payer: BLUE CROSS/BLUE SHIELD

## 2018-03-11 DIAGNOSIS — M25512 Pain in left shoulder: Secondary | ICD-10-CM | POA: Diagnosis not present

## 2018-03-11 MED ORDER — LIDOCAINE HCL 1 % IJ SOLN
3.0000 mL | INTRAMUSCULAR | Status: AC | PRN
  Administered 2018-03-11: 3 mL

## 2018-03-11 MED ORDER — METHYLPREDNISOLONE ACETATE 40 MG/ML IJ SUSP
40.0000 mg | INTRAMUSCULAR | Status: AC | PRN
  Administered 2018-03-11: 40 mg via INTRA_ARTICULAR

## 2018-03-11 NOTE — Progress Notes (Signed)
Office Visit Note   Patient: Patricia Thornton           Date of Birth: [birth year]           MRN: [id number] Visit Date: 03/11/2018              Requested by: Tonia Ghent, MD [address] [place], [place] [address] PCP: Tonia Ghent, MD   Assessment & Plan: Visit Diagnoses:  1. Acute pain of left shoulder     Plan: We went over her shoulder x-rays and I talked about trying a steroid injection in the subacromial space and she is agreeable to trying this.  I will also have her take Aleve twice a day.  She tolerated the steroid injection well.  We will see her back in 4 weeks unless she is pain-free.  At that visit she still having problems we will consider an MRI of her shoulder.  Follow-Up Instructions: Return in about 1 month (around 04/08/2018).   Orders:  Orders Placed This Encounter  Procedures  . Large Joint Inj  . XR Shoulder Left   No orders of the defined types were placed in this encounter.     Procedures: Large Joint Inj: L subacromial bursa on 03/11/2018 3:15 PM Indications: pain and diagnostic evaluation Details: 22 G 1.5 in needle  Arthrogram: No  Medications: 3 mL lidocaine 1 %; 40 mg methylPREDNISolone acetate 40 MG/ML Outcome: tolerated well, no immediate complications Procedure, treatment alternatives, risks and benefits explained, specific risks discussed. Consent was given by the patient. Immediately prior to procedure a time out was called to verify the correct patient, procedure, equipment, support staff and site/side marked as required. Patient was prepped and draped in the usual sterile fashion.       Clinical Data: No additional findings.   Subjective: Chief Complaint  Patient presents with  . Left Shoulder - Pain  Patient comes in today with 1-1-month history of worsening  left shoulder pain.  She did have a fall while she was walking and it did bug her shoulder for a while after that she is not sure if she injured it in the fall or not.  It hurts with overhead activities and reaching behind her.  She is been working out a little bit more and that aggravates her shoulder as well.  She denies any neck pain.  Some mild constant aching pain.  She does sleep with her arm above her head on that left side and it does wake her up at night in terms of the pain.  She has not tried any anti-inflammatories.  HPI  Review of Systems She currently denies any headache, chest pain, shortness of breath, fever, chills, nausea, vomiting.  She is alert and oriented x3 and in no acute distress  Objective: Vital Signs: LMP 05/03/2014   Physical Exam She is alert and oriented x3 and in no acute distress examination of her left shoulder shows it is well located.  The rotator cuff is not significantly weak.  She has significant pain with internal rotation and adduction. Ortho Exam  Specialty Comments:  No specialty comments available.  Imaging: Xr Shoulder Left  Result Date: 03/11/2018 3 views of the left shoulder show well located shoulder with no acute findings.  There is slight decrease in the subacromial outlet.    PMFS History: Patient Active Problem List   Diagnosis Date Noted  . Prediabetes 06/19/2017  . Well woman exam with routine gynecological exam 06/18/2016  . Obesity 06/14/2015  . EUSTACHIAN TUBE DYSFUNCTION 04/06/2010  . Vitamin D deficiency 01/30/2010  . FAMILIAL HYPERCHOLESTEROLEMIA 01/30/2010   Past Medical History:  Diagnosis Date  . AMA (advanced maternal age) multigravida 27+   . Endometriosis   . PONV (postoperative nausea and vomiting)     Family History  Problem Relation Age of Onset  . Diabetes Maternal Grandmother   . Cancer Maternal Grandmother   . Stroke Paternal Grandmother   . Cancer Paternal Grandfather   . Heart disease Mother         afib  . Other Neg Hx     Past Surgical History:  Procedure Laterality Date  . APPENDECTOMY    . BREAST REDUCTION SURGERY    . LAPAROSCOPY    . TONSILLECTOMY     Social History   Occupational History  . Not on file  Tobacco Use  . Smoking status: Never Smoker  . Smokeless tobacco: Never Used  Substance and Sexual Activity  . Alcohol use: No    Alcohol/week: 0.0 oz  . Drug use: No  . Sexual activity: Yes    Partners: Male    Birth control/protection: None, Post-menopausal

## 2018-04-08 ENCOUNTER — Ambulatory Visit (INDEPENDENT_AMBULATORY_CARE_PROVIDER_SITE_OTHER): Payer: BLUE CROSS/BLUE SHIELD | Admitting: Orthopaedic Surgery

## 2018-04-29 ENCOUNTER — Ambulatory Visit (INDEPENDENT_AMBULATORY_CARE_PROVIDER_SITE_OTHER): Payer: BLUE CROSS/BLUE SHIELD | Admitting: Orthopaedic Surgery

## 2018-06-01 ENCOUNTER — Encounter (INDEPENDENT_AMBULATORY_CARE_PROVIDER_SITE_OTHER): Payer: Self-pay | Admitting: Orthopaedic Surgery

## 2018-06-01 ENCOUNTER — Ambulatory Visit (INDEPENDENT_AMBULATORY_CARE_PROVIDER_SITE_OTHER): Payer: BLUE CROSS/BLUE SHIELD | Admitting: Orthopaedic Surgery

## 2018-06-01 DIAGNOSIS — M25512 Pain in left shoulder: Secondary | ICD-10-CM | POA: Diagnosis not present

## 2018-06-01 MED ORDER — DIAZEPAM 5 MG PO TABS: 5.0000 mg | ORAL_TABLET | Freq: Once | ORAL | 0 refills | Status: AC

## 2018-06-01 NOTE — Progress Notes (Signed)
The patient is a coming for return follow-up after having a steroid injection of the left shoulder.  For about a week to 2 weeks she got good relief with the shoulder and with him about exercises that severe pain like she had before her left shoulder with weakness.  Her pain is daily.  She hurts with overhead activities as well as reaching behind her.  She has significant weakness she states in the left shoulder.  On exam certainly she does have weakness with abduction and external rotation and my concern is she has a rotator cuff tear based on clinical exam.  At this point given the failure of all forms conservative treatment measures including a subacromial injection, activity modification, rest, home exercise program, anti-inflammatories and rest, and MRI is warranted to rule out rotator cuff tear of the left shoulder.  Again I feel this is necessary based on clinical exam showing significant weakness with abductionand external rotation of the left shoulder.  We will see her back once the MRI is obtained.  All question concerns were answered and addressed.

## 2018-06-02 ENCOUNTER — Other Ambulatory Visit (INDEPENDENT_AMBULATORY_CARE_PROVIDER_SITE_OTHER): Payer: Self-pay

## 2018-06-02 DIAGNOSIS — G8929 Other chronic pain: Secondary | ICD-10-CM

## 2018-06-02 DIAGNOSIS — M25512 Pain in left shoulder: Principal | ICD-10-CM

## 2018-06-20 ENCOUNTER — Ambulatory Visit
Admission: RE | Admit: 2018-06-20 | Discharge: 2018-06-20 | Disposition: A | Discharge status: Home / Self Care | Payer: BLUE CROSS/BLUE SHIELD | Source: Ambulatory Visit | Attending: Orthopaedic Surgery | Admitting: Orthopaedic Surgery

## 2018-06-20 DIAGNOSIS — M25512 Pain in left shoulder: Principal | ICD-10-CM

## 2018-06-20 DIAGNOSIS — M19011 Primary osteoarthritis, right shoulder: Secondary | ICD-10-CM | POA: Diagnosis not present

## 2018-06-20 DIAGNOSIS — G8929 Other chronic pain: Secondary | ICD-10-CM

## 2018-06-25 ENCOUNTER — Ambulatory Visit (INDEPENDENT_AMBULATORY_CARE_PROVIDER_SITE_OTHER): Payer: BLUE CROSS/BLUE SHIELD | Admitting: Orthopaedic Surgery

## 2018-06-25 ENCOUNTER — Encounter (INDEPENDENT_AMBULATORY_CARE_PROVIDER_SITE_OTHER): Payer: Self-pay | Admitting: Orthopaedic Surgery

## 2018-06-25 DIAGNOSIS — M25512 Pain in left shoulder: Secondary | ICD-10-CM

## 2018-06-25 DIAGNOSIS — M7542 Impingement syndrome of left shoulder: Secondary | ICD-10-CM | POA: Diagnosis not present

## 2018-06-25 DIAGNOSIS — G8929 Other chronic pain: Secondary | ICD-10-CM | POA: Diagnosis not present

## 2018-06-25 NOTE — Progress Notes (Signed)
The patient is here today for follow-up for her left shoulder.  We sent her for an MRI due to worsening left shoulder pain and decreased function of the shoulder.  She has had one subacromial steroid injection back in April of this year.  After unsuccessful conservative treatment we sent her for an MRI to assess the shoulder in general.  She is here for review this today.  She does report that she is feeling slightly better but she is afraid of developing a frozen shoulder.  On exam her shoulder motion is almost entirely full but she definitely shows signs of impingement and pain.  The MRI is reviewed with her and does show only mild tendinitis of the rotator cuff tendons and some mild AC joint arthritis.  The remainder the shoulder is normal.  There is an os acromiale.  I do feel that she would benefit the most from outpatient physical therapy to help decrease her shoulder pain and improve shoulder function.  I do also feel that she should try another subacromial injection but we will hold off on this until we see how she does with therapy.  She likes this treatment plan as well.  All questions concerns were answered and addressed.  We will work on setting up physical therapy for as an outpatient and see her in follow-up in about 4 weeks.

## 2018-06-30 ENCOUNTER — Ambulatory Visit: Payer: BLUE CROSS/BLUE SHIELD | Attending: Orthopaedic Surgery

## 2018-06-30 ENCOUNTER — Other Ambulatory Visit: Payer: Self-pay

## 2018-06-30 DIAGNOSIS — M6281 Muscle weakness (generalized): Secondary | ICD-10-CM

## 2018-06-30 DIAGNOSIS — R293 Abnormal posture: Secondary | ICD-10-CM | POA: Insufficient documentation

## 2018-06-30 DIAGNOSIS — M25512 Pain in left shoulder: Secondary | ICD-10-CM | POA: Insufficient documentation

## 2018-06-30 DIAGNOSIS — G8929 Other chronic pain: Secondary | ICD-10-CM

## 2018-06-30 NOTE — Patient Instructions (Signed)
Access Code: [email or web address]  URL: [email or web address]  Date: 06/30/2018  Prepared by: Sigurd Sos   Exercises  Supine Shoulder Flexion with Dowel - 10 reps - 1 sets - 10 hold - 3x daily - 7x weekly  Standing Shoulder External Rotation with Resistance - 10 reps - 2 sets - 2x daily - 7x weekly  Standing Shoulder Horizontal Abduction with Resistance - 10 reps - 2 sets - 2x daily - 7x weekly  Seated Cervical Flexion AROM - 10 reps - 3 sets - 3x daily - 7x weekly  Seated Cervical Sidebending AROM - 10 reps - 3 sets - 3x daily - 7x weekly  Seated Cervical Rotation AROM - 10 reps - 3 sets - 3x daily - 7x weekly  Seated Correct Posture - 10 reps - 3 sets - 1x daily - 7x weekly

## 2018-06-30 NOTE — Therapy (Addendum)
[facility] [address], [place], [place], [address] Phone: [phone]   Fax:  [phone]  Physical Therapy Evaluation  Patient Details  Name: Patricia Thornton MRN: [id number] Date of Birth: July 10, 1971 Referring Provider: Jean Rosenthal, MD   Encounter Date: 06/30/2018  PT End of Session - 06/30/18 0843    Visit Number  1    Date for PT Re-Evaluation  08/25/18    Authorization Type  BCBS    PT Start Time  0805    PT Stop Time  0845    PT Time Calculation (min)  40 min    Activity Tolerance  Patient tolerated treatment well    Behavior During Therapy  [facility] for tasks assessed/performed       Past Medical History:  Diagnosis Date  . AMA (advanced maternal age) multigravida 58+   . Endometriosis   . PONV (postoperative nausea and vomiting)     Past Surgical History:  Procedure Laterality Date  . APPENDECTOMY    . BREAST REDUCTION SURGERY    . LAPAROSCOPY    . TONSILLECTOMY      There were no vitals filed for this visit.   Subjective Assessment - 06/30/18 0807    Subjective  Pt reports to PT with complaints of Lt shoulder pain of a chronic nature with onset ~February 2019.  Pt had cortizone injection 03/11/18 with some relief with.  Pt had MRI that showed mild tendinitis of rotator cuff tendons and mild AC joint arthritis    How long can you walk comfortably?  use of Lt UE with overhead tasks    Diagnostic tests  MRI: mild tendinitis of Rt RTC tendons and AC joint arthritis    Patient Stated Goals  use Lt UE overhead, reduce pain    Currently in Pain?  Yes    Pain Score  0-No pain up to 3-5/10    Pain Location  Shoulder    Pain Orientation  Left    Pain Descriptors / Indicators  Dull    Pain Type  Chronic pain    Pain Onset  More than a month ago    Pain Frequency  Intermittent    Aggravating Factors   reaching overhead, overhead use of Lt UE, self-care (washing hair/getting dressed)    Pain  Relieving Factors  stop doing the aggravating activity    Effect of Pain on Daily Activities  not able to reach overhead         [facility] PT Assessment - 06/30/18 0001      Assessment   Medical Diagnosis  Chronic Lt shoulder pain, impingement syndrome of Lt shoulder    Referring Provider  Jean Rosenthal, MD    Onset Date/Surgical Date  01/09/18    Hand Dominance  Right    Next MD Visit  4 weeks    Prior Therapy  none      Precautions   Precautions  None      Restrictions   Weight Bearing Restrictions  No      Balance Screen   Has the patient fallen in the past 6 months  No    Has the patient had a decrease in activity level because of a fear of falling?   No    Is the patient reluctant to leave their home because of a fear of falling?   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  Pt with Rt=Lt shoulder A/ROM in all directions.  Pt demonstrated a painful arc with flexion and abduction on the Lt 80-120 degrees       PROM   Overall PROM   Within functional limits for tasks performed    Overall PROM Comments  pt is guarded with P/ROM into Lt shoulder flexion and abduction.  Full motion is achieved with verbal cues to relax.         Strength   Overall Strength  Deficits    Overall Strength Comments  Rt shoulder strength is 5/5    Strength Assessment Site  Shoulder    Right/Left Shoulder  Left    Left Shoulder Flexion  4+/5    Left Shoulder ABduction  4-/5    Left Shoulder Internal Rotation  5/5 pain    Left Shoulder External Rotation  5/5      Palpation   Palpation comment  palpable tenderness at supraspinatus tendon insertion and anterior deltoid      Ambulation/Gait   Gait Pattern  Within Functional Limits                Objective measurements completed on examination: See above findings.              PT Education - 06/30/18 0841    Education Details   Access Code: [email or web address]     Person(s) Educated  Patient    Methods  Demonstration;Explanation;Handout    Comprehension  Verbalized understanding;Returned demonstration       PT Short Term Goals - 06/30/18 1040      PT SHORT TERM GOAL #1   Title  be independent in initial HEP    Time  4    Period  Weeks    Status  New    Target Date  07/28/18      PT SHORT TERM GOAL #2   Title  report < or = to 3/10 Lt shoulder pain with reaching overhead    Time  4    Period  Weeks    Status  New    Target Date  07/28/18      PT SHORT TERM GOAL #3   Title  verbalize and demonstrate postural corrections with daily activity to promote scapular strength    Time  4    Period  Weeks    Status  New    Target Date  07/28/18      PT SHORT TERM GOAL #4   Title  report 25% increased ease with lifting, carrying and getting objects from overhead shelf with Lt UE    Time  4    Period  Weeks  Status  New    Target Date  07/28/18        PT Long Term Goals - 06/30/18 1042      PT LONG TERM GOAL #1   Title  be independent in advanced HEP    Time  8    Period  Weeks    Status  New    Target Date  08/25/18      PT LONG TERM GOAL #2   Title  report < or = to 1-2/10 Lt shoulder pain with overhead reaching    Time  8    Period  Weeks     Status  New    Target Date  08/25/18      PT LONG TERM GOAL #3   Title  exercise with Bowflex without increased pain or limitation    Time  8    Period  Weeks    Status  New    Target Date  08/25/18      PT LONG TERM GOAL #4   Title  report 75% increased ease with lifting, carrying and getting an object from overhead shelf with Lt UE    Time  8    Period  Weeks    Status  New    Target Date  08/25/18             Plan - 06/30/18 1034    Clinical Impression Statement  Pt presents to PT with Lt shoulder pain that began ~01/2018 without cause.  Pt received a cortizone injection 03/11/18 with some relief of symptoms after.  Pt reports 0-5/10 Lt shoulder pain that is intermittent and exaccerbated with overhead use and lifting anything with weight.  Pt has been limiting her use of the Lt UE and has not been exercising with her Bowflex due to fear of pain.  Pt with protracted scapula bilaterally and forward head posture.  Pt with palpable tenderness over Lt anterior deltoid and supraspinatus tendon.  Pt will benefit from skilled PT for postural strength, dry needling, manual and education on how to use Bowflex to allow for safe exercise and return to use of Lt UE overhead.      History and Personal Factors relevant to plan of care:  none    Clinical Presentation  Stable    Clinical Decision Making  Low    Rehab Potential  Good    PT Frequency  2x / week 1x/wk likely    PT Duration  8 weeks    PT Treatment/Interventions  ADLs/Self Care Home Management;Cryotherapy;Electrical Stimulation;Ultrasound;Functional mobility training;Therapeutic activities;Therapeutic exercise;Patient/family education;Neuromuscular re-education;Manual techniques;Passive range of motion;Taping;Dry needling    PT Next Visit Plan  advance HEP for postural strength to promote scapular retraction, kinesiotaping for scapula, Lt shoulder a/ROM    PT Home Exercise Plan  Access Code: [email or web address]        Patient will benefit  from skilled therapeutic intervention in order to improve the following deficits and impairments:  Pain, Impaired UE functional use, Decreased strength, Decreased activity tolerance, Increased muscle spasms, Postural dysfunction  Visit Diagnosis: Chronic left shoulder pain - Plan: PT plan of care cert/re-cert  Abnormal posture - Plan: PT plan of care cert/re-cert  Muscle weakness (generalized) - Plan: PT plan of care cert/re-cert     Problem List Patient Active Problem List   Diagnosis Date Noted  . Prediabetes 06/19/2017  . Well woman exam with routine gynecological exam 06/18/2016  . Obesity 06/14/2015  . EUSTACHIAN TUBE DYSFUNCTION 04/06/2010  .  Vitamin D deficiency 01/30/2010  . FAMILIAL HYPERCHOLESTEROLEMIA 01/30/2010     Sigurd Sos, PT 06/30/18 10:46 AM PHYSICAL THERAPY DISCHARGE SUMMARY  Visits from Start of Care: 1  Current functional level related to goals / functional outcomes: Pt didn't return after evaluation on 06/30/18   Remaining deficits: See above for current status.     Education / Equipment: HEP Plan: Patient agrees to discharge.  Patient goals were not met. Patient is being discharged due to not returning since the last visit.  ?????         Sigurd Sos, PT 10/20/18 9:31 AM   [facility] [address], [place], [place], [address] Phone: [phone]   Fax:  [phone]  Name: Patricia Thornton MRN: [id number] Date of Birth: 12-28-1970

## 2018-07-06 ENCOUNTER — Ambulatory Visit: Payer: BLUE CROSS/BLUE SHIELD | Admitting: Physical Therapy

## 2018-07-13 ENCOUNTER — Encounter: Payer: BLUE CROSS/BLUE SHIELD | Admitting: Physical Therapy

## 2018-07-23 ENCOUNTER — Ambulatory Visit (INDEPENDENT_AMBULATORY_CARE_PROVIDER_SITE_OTHER): Payer: BLUE CROSS/BLUE SHIELD | Admitting: Orthopaedic Surgery

## 2018-07-29 ENCOUNTER — Encounter: Payer: BLUE CROSS/BLUE SHIELD | Admitting: Physical Therapy

## 2018-08-27 ENCOUNTER — Ambulatory Visit (INDEPENDENT_AMBULATORY_CARE_PROVIDER_SITE_OTHER): Payer: BLUE CROSS/BLUE SHIELD | Admitting: Orthopaedic Surgery

## 2018-09-08 ENCOUNTER — Encounter (INDEPENDENT_AMBULATORY_CARE_PROVIDER_SITE_OTHER): Payer: Self-pay | Admitting: Orthopaedic Surgery

## 2018-09-08 ENCOUNTER — Ambulatory Visit (INDEPENDENT_AMBULATORY_CARE_PROVIDER_SITE_OTHER): Payer: BLUE CROSS/BLUE SHIELD | Admitting: Orthopaedic Surgery

## 2018-09-08 DIAGNOSIS — M7542 Impingement syndrome of left shoulder: Secondary | ICD-10-CM

## 2018-09-08 MED ORDER — METHYLPREDNISOLONE ACETATE 40 MG/ML IJ SUSP
40.0000 mg | INTRAMUSCULAR | Status: AC | PRN
  Administered 2018-09-08: 40 mg via INTRA_ARTICULAR

## 2018-09-08 MED ORDER — LIDOCAINE HCL 1 % IJ SOLN
3.0000 mL | INTRAMUSCULAR | Status: AC | PRN
  Administered 2018-09-08: 3 mL

## 2018-09-08 NOTE — Progress Notes (Signed)
Office Visit Note   Patient: Patricia Thornton           Date of Birth: Dec 25, 1970           MRN: [id number] Visit Date: 09/08/2018              Requested by: Tonia Ghent, MD [address] [place], [place] [address] PCP: Tonia Ghent, MD   Assessment & Plan: Visit Diagnoses:  1. Impingement syndrome of left shoulder     Plan: She is given handouts on shoulder exercises and these are reviewed with her.  Gave her prescription to go back to physical therapy for home exercise program for her shoulder that she can go back and reproduce on her own.  Suggested that she take Aleve 2 tablets twice daily with food for 1 month.  See her back on as-needed basis pain persist or becomes worse.  Follow-Up Instructions: Return if symptoms worsen or fail to improve.   Orders:  Orders Placed This Encounter  Procedures  . Large Joint Inj   No orders of the defined types were placed in this encounter.     Procedures: Large Joint Inj: L subacromial bursa on 09/08/2018 11:16 AM Indications: pain Details: 22 G 1.5 in needle, superior approach  Arthrogram: No  Medications: 3 mL lidocaine 1 %; 40 mg methylPREDNISolone acetate 40 MG/ML Outcome: tolerated well, no immediate complications Procedure, treatment alternatives, risks and benefits explained, specific risks discussed. Consent was given by the patient. Immediately prior to procedure a time out was called to verify the correct patient, procedure, equipment, support staff and site/side marked as required. Patient was prepped and draped in the usual sterile fashion.       Clinical Data: No additional findings.   Subjective: Chief Complaint  Patient presents with  . Left Shoulder - Follow-up    HPI Mrs. Wool returns today for follow-up of her left shoulder.  She states still having pain in the shoulder she only went to therapy wants to do the cost and it really could not tell a difference.  She did get some exercise doing  her own at home and she is done these periodically.  Exam pain in the shoulder with overhead reaching reaching back behind her and reaching in front of her.  Is taking no NSAIDs.  Denies any numbness tingling down either arm or neck pain.  Review of Systems See HPI  Objective: Vital Signs: LMP 05/03/2014   Physical Exam  Constitutional: She is oriented to person, place, and time. She appears well-developed and well-nourished. No distress.  Pulmonary/Chest: Effort normal.  Neurological: She is alert and oriented to person, place, and time.  Skin: She is not diaphoretic.    Ortho Exam Left shoulder positive impingement sign.  5 out of 5 strength with external and internal rotation against resistance.  Empty can test negative bilaterally Specialty Comments:  No specialty comments available.  Imaging: No results found.   PMFS History: Patient Active Problem List   Diagnosis Date Noted  . Prediabetes 06/19/2017  . Well woman exam with routine gynecological exam 06/18/2016  . Obesity 06/14/2015  . EUSTACHIAN TUBE DYSFUNCTION 04/06/2010  . Vitamin D deficiency 01/30/2010  . FAMILIAL HYPERCHOLESTEROLEMIA 01/30/2010   Past Medical History:  Diagnosis Date  . AMA (advanced maternal age) multigravida 56+   . Endometriosis   . PONV (postoperative nausea and vomiting)     Family History  Problem Relation Age of Onset  . Diabetes Maternal  Grandmother   . Cancer Maternal Grandmother   . Stroke Paternal Grandmother   . Cancer Paternal Grandfather   . Heart disease Mother        afib  . Other Neg Hx     Past Surgical History:  Procedure Laterality Date  . APPENDECTOMY    . BREAST REDUCTION SURGERY    . LAPAROSCOPY    . TONSILLECTOMY     Social History   Occupational History  . Not on file  Tobacco Use  . Smoking status: Never Smoker  . Smokeless tobacco: Never Used  Substance and Sexual Activity  . Alcohol use: No    Alcohol/week: 0.0 standard drinks  . Drug use: No    . Sexual activity: Yes    Partners: Male    Birth control/protection: None, Post-menopausal

## 2018-09-11 ENCOUNTER — Other Ambulatory Visit: Payer: Self-pay | Admitting: Obstetrics

## 2018-09-11 DIAGNOSIS — Z1231 Encounter for screening mammogram for malignant neoplasm of breast: Secondary | ICD-10-CM

## 2018-09-25 ENCOUNTER — Ambulatory Visit
Admission: RE | Admit: 2018-09-25 | Discharge: 2018-09-25 | Disposition: A | Discharge status: Home / Self Care | Payer: BLUE CROSS/BLUE SHIELD | Source: Ambulatory Visit | Attending: Obstetrics | Admitting: Obstetrics

## 2018-09-25 DIAGNOSIS — Z1231 Encounter for screening mammogram for malignant neoplasm of breast: Secondary | ICD-10-CM | POA: Diagnosis not present

## 2018-09-30 ENCOUNTER — Encounter: Payer: Self-pay | Admitting: Obstetrics and Gynecology

## 2018-09-30 ENCOUNTER — Ambulatory Visit (INDEPENDENT_AMBULATORY_CARE_PROVIDER_SITE_OTHER): Payer: BLUE CROSS/BLUE SHIELD | Admitting: Obstetrics and Gynecology

## 2018-09-30 VITALS — BP 109/72 | HR 76 | Ht 65.0 in | Wt 207.0 lb

## 2018-09-30 DIAGNOSIS — Z01419 Encounter for gynecological examination (general) (routine) without abnormal findings: Secondary | ICD-10-CM

## 2018-09-30 DIAGNOSIS — Z780 Asymptomatic menopausal state: Secondary | ICD-10-CM | POA: Insufficient documentation

## 2018-09-30 DIAGNOSIS — Z0000 Encounter for general adult medical examination without abnormal findings: Secondary | ICD-10-CM

## 2018-09-30 DIAGNOSIS — R7303 Prediabetes: Secondary | ICD-10-CM

## 2018-09-30 MED ORDER — PHENTERMINE HCL 37.5 MG PO CAPS: 37.5000 mg | ORAL_CAPSULE | ORAL | 2 refills | Status: DC

## 2018-09-30 NOTE — Progress Notes (Signed)
Patient presents for her Annual Exam today.  Last pap: 06/19/2017 WNL  EUV:[id number]  Mammogram:3D 09/25/2018 WNL repeat in 1 yr . CC: None per pt  VACCINES: Pt received FLU vaccine a few wks ago. Tdap 02/12/13

## 2018-09-30 NOTE — Progress Notes (Signed)
GYNECOLOGY ANNUAL PREVENTATIVE CARE ENCOUNTER NOTE  Subjective:   Patricia Thornton is a 47 y.o. [phone] female here for a routine annual gynecologic exam.  Current complaints: Weight gain. Has been prescribed phentermine in the past and had good results however gained all the weight back when she stopped it.   Denies abnormal vaginal bleeding, discharge, pelvic pain, problems with intercourse or other gynecologic concerns.    Gynecologic History Patient's last menstrual period was 05/03/2014. Contraception: None Last Pap: 06/19/2017. Results were: normal with negative HPV Last mammogram: 09/25/2018. Results were: normal  Obstetric History OB History  Gravida Para Term Preterm AB Living  6 3 3  0 3 3  SAB TAB Ectopic Multiple Live Births  3 0 0 0 3    # Outcome Date GA Lbr Len/2nd Weight Sex Delivery Anes PTL Lv  6 Term 02/11/13 [redacted]w[redacted]d 02:15 / 00:59 7 lb 0.7 oz (3.195 kg) F Vag-Spont EPI  LIV     Birth Comments: WNL  5 SAB 01/2012          4 SAB 2011          3 Term 2009 [redacted]w[redacted]d 12:00 7 lb 12 oz (3.515 kg) F Vag-Spont EPI  LIV  2 SAB 2008 [redacted]w[redacted]d         1 Term 2006 [redacted]w[redacted]d 07:00 8 lb 6 oz (3.799 kg) M Vag-Spont   LIV    Past Medical History:  Diagnosis Date  . AMA (advanced maternal age) multigravida 32+   . Endometriosis   . PONV (postoperative nausea and vomiting)     Past Surgical History:  Procedure Laterality Date  . APPENDECTOMY    . BREAST REDUCTION SURGERY    . LAPAROSCOPY    . TONSILLECTOMY      Current Outpatient Medications on File Prior to Visit  Medication Sig Dispense Refill  . aspirin-acetaminophen-caffeine (EXCEDRIN MIGRAINE) 250-250-65 MG per tablet Take by mouth every 6 (six) hours as needed for headache.    Nyoka Cowden Tea, Camillia sinensis, 250 MG CAPS Take by mouth.    . triamcinolone ointment (KENALOG) 0.5 % Apply 1 application topically 2 (two) times daily. 30 g 2  . Vitamin D, Ergocalciferol, (DRISDOL) 50000 units CAPS capsule Take 1 capsule  (50,000 Units total) by mouth every 7 (seven) days. 30 capsule 2   No current facility-administered medications on file prior to visit.     No Known Allergies  Social History:  reports that she has never smoked. She has never used smokeless tobacco. She reports that she does not drink alcohol or use drugs.  Family History  Problem Relation Age of Onset  . Diabetes Maternal Grandmother   . Cancer Maternal Grandmother   . Stroke Paternal Grandmother   . Cancer Paternal Grandfather   . Heart disease Mother        afib  . Other Neg Hx     The following portions of the patient's history were reviewed and updated as appropriate: allergies, current medications, past family history, past medical history, past social history, past surgical history and problem list.  Review of Systems Pertinent items noted in HPI and remainder of comprehensive ROS otherwise negative.   Objective:  BP 109/72   Pulse 76   Ht 5\' 5"  (1.651 m)   Wt 207 lb (93.9 kg)   LMP 05/03/2014   BMI 34.45 kg/m  CONSTITUTIONAL: Well-developed, well-nourished female in no acute distress.  HENT:  Normocephalic, atraumatic, External right and left ear normal. Oropharynx  is clear and moist EYES: Conjunctivae and EOM are normal. Pupils are equal, round, and reactive to light. No scleral icterus.  NECK: Normal range of motion, supple, no masses.  Normal thyroid.  SKIN: Skin is warm and dry. No rash noted. Not diaphoretic. No erythema. No pallor. MUSCULOSKELETAL: Normal range of motion. No tenderness.  No cyanosis, clubbing, or edema.  2+ distal pulses. NEUROLOGIC: Alert and oriented to person, place, and time. Normal reflexes, muscle tone coordination. No cranial nerve deficit noted. PSYCHIATRIC: Normal mood and affect. Normal behavior. Normal judgment and thought content. CARDIOVASCULAR: Normal heart rate noted, regular rhythm RESPIRATORY: Clear to auscultation bilaterally. Effort and breath sounds normal, no problems with  respiration noted. BREASTS: Symmetric in size. No masses, skin changes, nipple drainage, or lymphadenopathy. ABDOMEN: Soft, normal bowel sounds, no distention noted.  No tenderness, rebound or guarding.  PELVIC:  Normal uterine size, no other palpable masses, no uterine or adnexal tenderness.  Assessment and Plan:   1. Physical exam, annual - TSH - HgB A1c - Vitamin D 1,25 dihydroxy  2. Prediabetes  - A1c today  - Patient to follow up with her PCP.  - phentermine 37.5 MG capsule; Take 1 capsule (37.5 mg total) by mouth every morning. Will give RX today however she will need to f/u with PCP.    3. Class 3 severe obesity without serious comorbidity in adult, unspecified BMI, unspecified obesity type ([place])  -Patient given contact information to [facility].     There are no diagnoses linked to this encounter. Will follow up results of pap smear and manage accordingly. Mammogram done and normal.  Routine preventative health maintenance measures emphasized. Please refer to After Visit Summary for other counseling recommendations.    Tyreque Finken, Artist Pais, [place] for [employer], [place]

## 2018-10-05 LAB — VITAMIN D 1,25 DIHYDROXY
Vitamin D 1, 25 (OH)2 Total: 52 pg/mL
Vitamin D2 1, 25 (OH)2: <10 pg/mL
Vitamin D3 1, 25 (OH)2: 45 pg/mL

## 2018-10-05 LAB — HEMOGLOBIN A1C
Est. average glucose Bld gHb Est-mCnc: 108 mg/dL
Hgb A1c MFr Bld: 5.4 % (ref 4.8–5.6)

## 2018-10-05 LAB — TSH: TSH: 1.53 u[IU]/mL (ref 0.450–4.500)

## 2018-10-21 ENCOUNTER — Telehealth: Payer: Self-pay

## 2018-10-21 NOTE — Telephone Encounter (Signed)
TC from pt requests Phentermine to be sent to [facility] on [address].  I called [facility] to have it transferred but they could not since rx is a controlled substance and that she would need a new rx. Informed pt I would have to consult with the provider and c/b.

## 2018-11-23 DIAGNOSIS — L821 Other seborrheic keratosis: Secondary | ICD-10-CM | POA: Diagnosis not present

## 2018-11-23 DIAGNOSIS — D225 Melanocytic nevi of trunk: Secondary | ICD-10-CM | POA: Diagnosis not present

## 2018-11-23 DIAGNOSIS — Z85828 Personal history of other malignant neoplasm of skin: Secondary | ICD-10-CM | POA: Diagnosis not present

## 2018-11-23 DIAGNOSIS — L814 Other melanin hyperpigmentation: Secondary | ICD-10-CM | POA: Diagnosis not present

## 2019-01-06 ENCOUNTER — Other Ambulatory Visit: Payer: Self-pay

## 2019-02-02 ENCOUNTER — Ambulatory Visit (INDEPENDENT_AMBULATORY_CARE_PROVIDER_SITE_OTHER): Payer: BLUE CROSS/BLUE SHIELD | Admitting: Physician Assistant

## 2019-02-17 ENCOUNTER — Ambulatory Visit (INDEPENDENT_AMBULATORY_CARE_PROVIDER_SITE_OTHER): Payer: 59 | Admitting: Orthopaedic Surgery

## 2019-02-17 ENCOUNTER — Other Ambulatory Visit: Payer: Self-pay

## 2019-02-17 ENCOUNTER — Encounter (INDEPENDENT_AMBULATORY_CARE_PROVIDER_SITE_OTHER): Payer: Self-pay | Admitting: Orthopaedic Surgery

## 2019-02-17 DIAGNOSIS — M7542 Impingement syndrome of left shoulder: Secondary | ICD-10-CM | POA: Insufficient documentation

## 2019-02-17 MED ORDER — LIDOCAINE HCL 1 % IJ SOLN
3.0000 mL | INTRAMUSCULAR | Status: AC | PRN
  Administered 2019-02-17: 3 mL

## 2019-02-17 MED ORDER — METHYLPREDNISOLONE ACETATE 40 MG/ML IJ SUSP
40.0000 mg | INTRAMUSCULAR | Status: AC | PRN
  Administered 2019-02-17: 40 mg via INTRA_ARTICULAR

## 2019-02-17 NOTE — Addendum Note (Signed)
Addended byLaurann [place] on: 02/17/2019 01:34 PM   Modules accepted: Orders

## 2019-02-17 NOTE — Progress Notes (Signed)
Office Visit Note   Patient: Patricia Thornton           Date of Birth: 1971-08-21           MRN: [id number] Visit Date: 02/17/2019              Requested by: Tonia Ghent, MD [address] [place], [place] [address] PCP: Tonia Ghent, MD   Assessment & Plan: Visit Diagnoses:  1. Impingement syndrome of left shoulder     Plan: We will send her back to formal physical therapy for range of motion strengthening home exercise program and modalities to the left shoulder.  She will follow-up with [place] in 2 months to see how she is progressing.  She may still yet benefit from a left shoulder arthroscopy with subacromial decompression and extensive debridement.  Questions encouraged and answered.  Follow-Up Instructions: Return in about 2 months (around 04/19/2019).   Orders:  No orders of the defined types were placed in this encounter.  No orders of the defined types were placed in this encounter.     Procedures: Large Joint Inj: L subacromial bursa on 02/17/2019 9:32 AM Indications: pain Details: 22 G 1.5 in needle, superior approach  Arthrogram: No  Medications: 3 mL lidocaine 1 %; 40 mg methylPREDNISolone acetate 40 MG/ML Outcome: tolerated well, no immediate complications Procedure, treatment alternatives, risks and benefits explained, specific risks discussed. Consent was given by the patient. Immediately prior to procedure a time out was called to verify the correct patient, procedure, equipment, support staff and site/side marked as required. Patient was prepped and draped in the usual sterile fashion.       Clinical Data: No additional findings.   Subjective: Chief Complaint  Patient presents with  . Left Shoulder - Follow-up    HPI Patricia Thornton returns today follow-up of her left shoulder.  She states she got good relief from the injection for about 2 to 3 months.  She has had now approximately 2-3 shots in the left shoulder which does help with the pain.   She went to one therapy session the initial visit but could not be told how much it would cost for visit and therefore did not follow-up she is really not done a home exercise program.  She having pain mostly with overhead activities.  She had no numbness tingling down the arm. Review of Systems See HPI  Objective: Vital Signs: LMP 05/03/2014   Physical Exam General: Well-developed well-nourished female no acute distress mood and affect appropriate Psych: Alert and oriented x3 Ortho Exam Left shoulder she has 5-5 strength with external and internal rotation against resistance.  Positive impingement testing on the left.  Nontender over the left acromium AC joint.  Abduction test only causes mild discomfort. Specialty Comments:  No specialty comments available.  Imaging: No results found.   PMFS History: Patient Active Problem List   Diagnosis Date Noted  . Impingement syndrome of left shoulder 02/17/2019  . Post-menopausal 09/30/2018  . Prediabetes 06/19/2017  . Physical exam, annual 06/18/2016  . Obesity 06/14/2015  . EUSTACHIAN TUBE DYSFUNCTION 04/06/2010  . Vitamin D deficiency 01/30/2010  . FAMILIAL HYPERCHOLESTEROLEMIA 01/30/2010   Past Medical History:  Diagnosis Date  . AMA (advanced maternal age) multigravida 36+   . Endometriosis   . PONV (postoperative nausea and vomiting)     Family History  Problem Relation Age of Onset  . Diabetes Maternal Grandmother   . Cancer Maternal Grandmother   .  Stroke Paternal Grandmother   . Cancer Paternal Grandfather   . Heart disease Mother        afib  . Other Neg Hx     Past Surgical History:  Procedure Laterality Date  . APPENDECTOMY    . BREAST REDUCTION SURGERY    . LAPAROSCOPY    . TONSILLECTOMY     Social History   Occupational History  . Not on file  Tobacco Use  . Smoking status: Never Smoker  . Smokeless tobacco: Never Used  Substance and Sexual Activity  . Alcohol use: No    Alcohol/week: 0.0 standard  drinks  . Drug use: No  . Sexual activity: Yes    Partners: Male    Birth control/protection: None, Post-menopausal

## 2019-02-22 ENCOUNTER — Ambulatory Visit: Payer: 59 | Attending: Physician Assistant | Admitting: Physical Therapy

## 2019-04-21 ENCOUNTER — Ambulatory Visit: Payer: Self-pay | Admitting: Orthopaedic Surgery

## 2019-06-30 ENCOUNTER — Other Ambulatory Visit: Payer: Self-pay

## 2019-06-30 ENCOUNTER — Encounter: Payer: Self-pay | Admitting: Physician Assistant

## 2019-06-30 ENCOUNTER — Ambulatory Visit (INDEPENDENT_AMBULATORY_CARE_PROVIDER_SITE_OTHER): Payer: 59 | Admitting: Physician Assistant

## 2019-06-30 ENCOUNTER — Ambulatory Visit (INDEPENDENT_AMBULATORY_CARE_PROVIDER_SITE_OTHER): Payer: 59

## 2019-06-30 VITALS — Ht 65.5 in | Wt 205.0 lb

## 2019-06-30 DIAGNOSIS — M25552 Pain in left hip: Secondary | ICD-10-CM

## 2019-06-30 MED ORDER — DIAZEPAM 2 MG PO TABS: ORAL_TABLET | ORAL | 0 refills | Status: DC

## 2019-06-30 NOTE — Progress Notes (Signed)
Office Visit Note   Patient: Patricia Thornton           Date of Birth: 02/19/1971           MRN: [id number] Visit Date: 06/30/2019              Requested by: Tonia Ghent, MD [address] [place],  [place] [address] PCP: Tonia Ghent, MD   Assessment & Plan: Visit Diagnoses:  1. Pain in left hip     Plan: We will obtain an MRI with arthrogram of the left hip to rule out labral tear.  She will follow-up after the MRI to go over results discuss further treatment.  She is given Valium to take prior to procedure due to anxiety.  Questions were encouraged and answered at length.  Follow-Up Instructions: Return for after MRI.   Orders:  Orders Placed This Encounter  Procedures  . XR HIP UNILAT W OR W/O PELVIS 2-3 VIEWS LEFT   No orders of the defined types were placed in this encounter.     Procedures: No procedures performed   Clinical Data: No additional findings.   Subjective: Chief Complaint  Patient presents with  . Left Hip - Pain    HPI Mrs. Loureiro is well-known to Dr. Ninfa Linden service.  She comes in today with new complaint of left hip pain.  States pain is been going for several months.  No known injury.  She states she has groin pain.  Pain is worse when she is doing stretches working out.  She also notes whenever she is to get out of a car or externally rotates the left hip she has pain deep in the groin.  She feels that the hip may give way at some time.  Also crossing her legs causes pain left hip.  She is having no numbness tingling down the leg.  Review of Systems See HPI   Objective: Vital Signs: Ht 5' 5.5" (1.664 m)   Wt 205 lb (93 kg)   LMP 05/03/2014   BMI 33.59 kg/m   Physical Exam Constitutional:      Appearance: She is not ill-appearing or diaphoretic.  Pulmonary:     Effort: Pulmonary effort is normal.  Neurological:     Mental Status: She is alert and oriented to person, place, and time.  Psychiatric:        Mood and  Affect: Mood normal.        Behavior: Behavior normal.     Ortho Exam Bilateral hips she has tenderness over the trochanteric region left greater than right.  Good range of motion of the right hip without pain.  She has discomfort with extreme internal rotation of the left hip and has pain groin area with any external rotation of the left hip.  Decreased external rotation of the left hip compared to right. Specialty Comments:  No specialty comments available.  Imaging: Xr Hip Unilat W Or W/o Pelvis 2-3 Views Left  Result Date: 06/30/2019 AP pelvis and lateral view left hip: Both hips are well located on the AP pelvis.  No acute fractures.  No bony abnormalities.  Both hips are well preserved.    PMFS History: Patient Active Problem List   Diagnosis Date Noted  . Impingement syndrome of left shoulder 02/17/2019  . Post-menopausal 09/30/2018  . Prediabetes 06/19/2017  . Physical exam, annual 06/18/2016  . Obesity 06/14/2015  . EUSTACHIAN TUBE DYSFUNCTION 04/06/2010  . Vitamin D deficiency 01/30/2010  .  FAMILIAL HYPERCHOLESTEROLEMIA 01/30/2010   Past Medical History:  Diagnosis Date  . AMA (advanced maternal age) multigravida 75+   . Endometriosis   . PONV (postoperative nausea and vomiting)     Family History  Problem Relation Age of Onset  . Diabetes Maternal Grandmother   . Cancer Maternal Grandmother   . Stroke Paternal Grandmother   . Cancer Paternal Grandfather   . Heart disease Mother        afib  . Other Neg Hx     Past Surgical History:  Procedure Laterality Date  . APPENDECTOMY    . BREAST REDUCTION SURGERY    . LAPAROSCOPY    . TONSILLECTOMY     Social History   Occupational History  . Not on file  Tobacco Use  . Smoking status: Never Smoker  . Smokeless tobacco: Never Used  Substance and Sexual Activity  . Alcohol use: No    Alcohol/week: 0.0 standard drinks  . Drug use: No  . Sexual activity: Yes    Partners: Male    Birth control/protection:  None, Post-menopausal

## 2019-07-16 ENCOUNTER — Ambulatory Visit
Admission: RE | Admit: 2019-07-16 | Discharge: 2019-07-16 | Disposition: A | Discharge status: Home / Self Care | Payer: 59 | Source: Ambulatory Visit | Attending: Physician Assistant | Admitting: Physician Assistant

## 2019-07-16 DIAGNOSIS — M25552 Pain in left hip: Secondary | ICD-10-CM

## 2019-07-16 MED ORDER — IOPAMIDOL (ISOVUE-M 200) INJECTION 41%
12.0000 mL | Freq: Once | INTRAMUSCULAR | Status: AC
  Administered 2019-07-16: 12 mL via INTRA_ARTICULAR

## 2019-07-19 IMAGING — MG 2D DIGITAL SCREENING BILATERAL MAMMOGRAM WITH CAD AND ADJUNCT TO
8 of 12 series · 8 of 28 positions shown · non-contrast
Comparison: Previous exam(s).

ACR Breast Density Category a: The breast tissue is almost entirely
fatty.

CLINICAL DATA: Screening.

EXAM:
2D DIGITAL SCREENING BILATERAL MAMMOGRAM WITH CAD AND ADJUNCT TOMO

[R MLO]
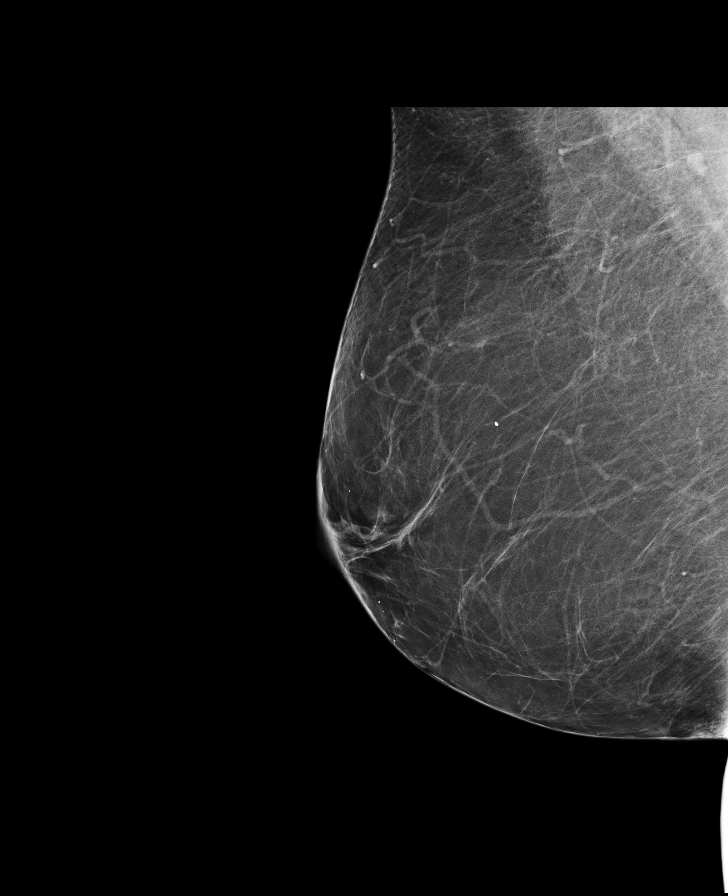

[R MLO synth-2D]
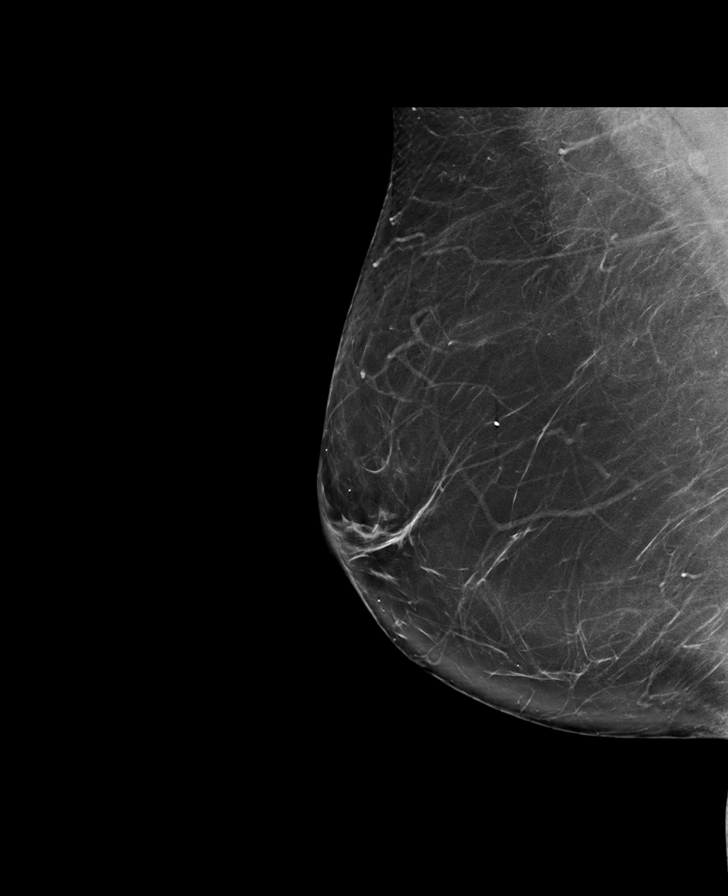

[L CC]
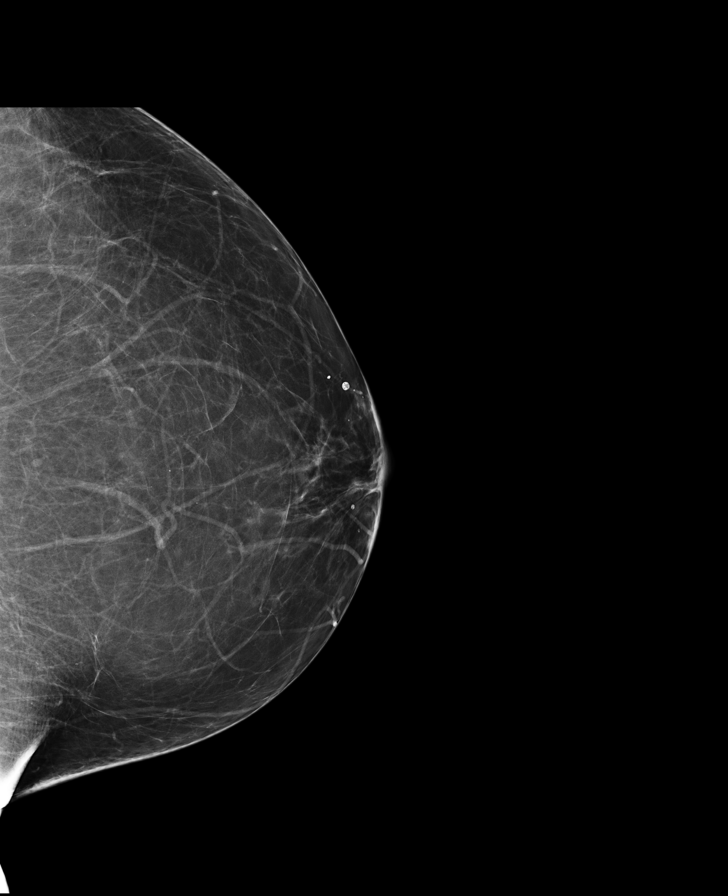

[L CC synth-2D]
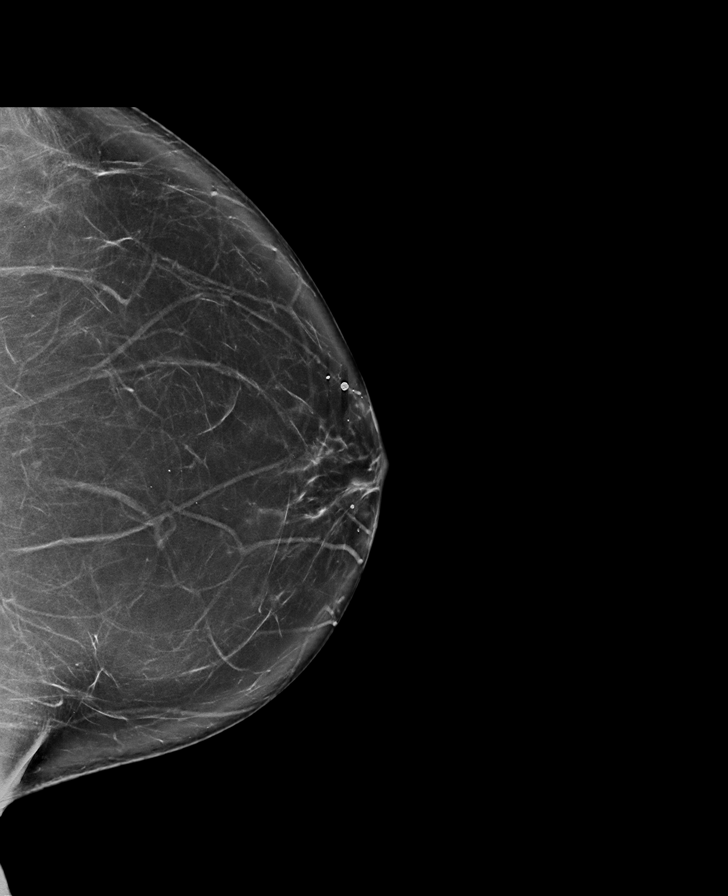

[L MLO synth-2D]
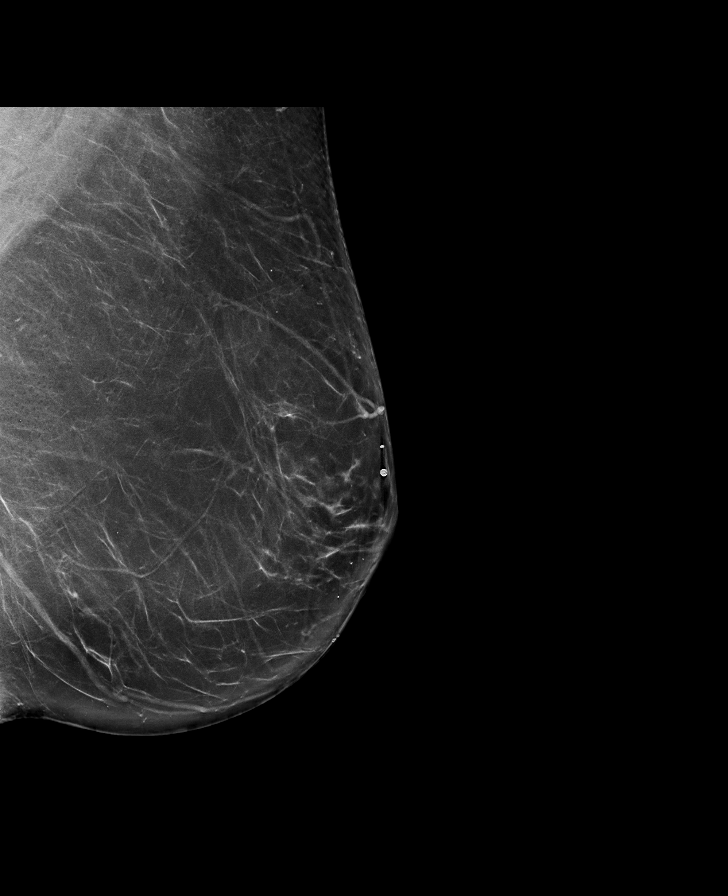

[R CC]
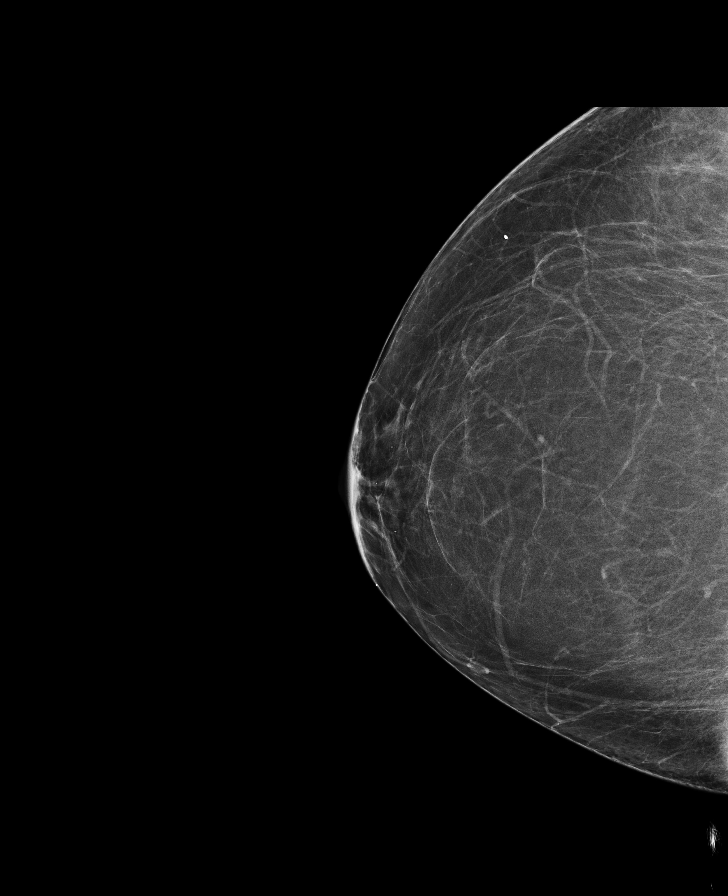

[R CC synth-2D]
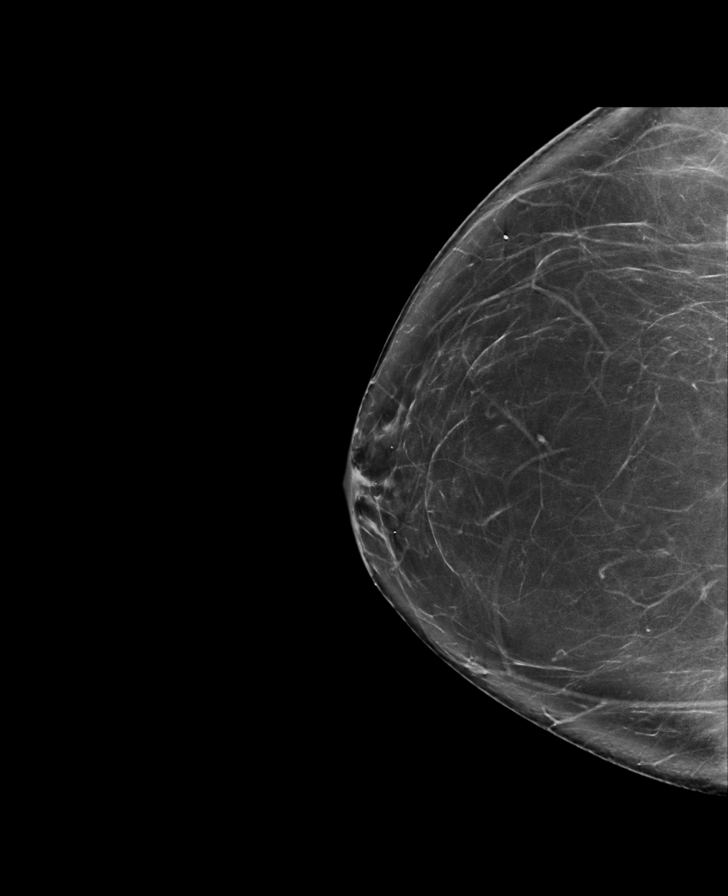

[L MLO]
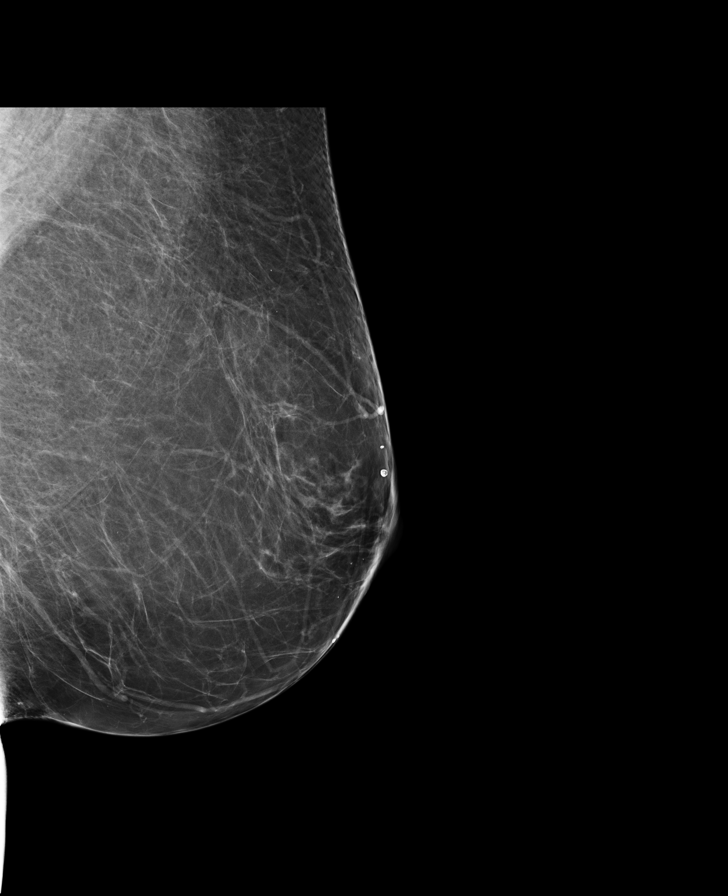

[8 of 28 positions shown; findings below may reference images not displayed]

FINDINGS: There are no findings suspicious for malignancy. Images were
processed with CAD.
IMPRESSION: No mammographic evidence of malignancy. A result letter of this
screening mammogram will be mailed directly to the patient.

RECOMMENDATION:
Screening mammogram in one year. (Code:[id number])

BI-RADS CATEGORY  1: Negative.

## 2019-07-21 ENCOUNTER — Encounter: Payer: Self-pay | Admitting: Physician Assistant

## 2019-07-21 ENCOUNTER — Other Ambulatory Visit: Payer: Self-pay

## 2019-07-21 ENCOUNTER — Ambulatory Visit (INDEPENDENT_AMBULATORY_CARE_PROVIDER_SITE_OTHER): Payer: 59 | Admitting: Physician Assistant

## 2019-07-21 VITALS — Ht 65.0 in | Wt 200.0 lb

## 2019-07-21 DIAGNOSIS — M25512 Pain in left shoulder: Secondary | ICD-10-CM

## 2019-07-21 NOTE — Progress Notes (Signed)
HPI: Patricia Thornton returns to go over MRI arthrogram of her left hip.  States she said no real change in symptoms.  She is taking no medications for this.  MRI of the left hip is reviewed with the patient.  The MRI shows normal cartilage and no labral tear.  Mild bilateral tendinosis of the hamstrings noted.  No acute fractures or AVN left hip  Impression: Left hip pain  Plan: After going over the results with the patient given her copy of the MRI results she states that she is relieved that nothing is torn.  She can resume normal activities.  I did offer formal physical therapy she defers she would like to continue to work on stretching on her own.  She will follow-up with [place] if inflammation becomes worse or she has any concerns.

## 2019-07-28 ENCOUNTER — Ambulatory Visit: Payer: BLUE CROSS/BLUE SHIELD | Admitting: Physician Assistant

## 2019-09-20 ENCOUNTER — Other Ambulatory Visit: Payer: Self-pay

## 2019-09-20 DIAGNOSIS — Z20822 Contact with and (suspected) exposure to covid-19: Secondary | ICD-10-CM

## 2019-09-21 LAB — NOVEL CORONAVIRUS, NAA: SARS-CoV-2, NAA: NOT DETECTED

## 2019-10-26 ENCOUNTER — Other Ambulatory Visit: Payer: Self-pay | Admitting: Obstetrics

## 2019-10-26 DIAGNOSIS — Z1231 Encounter for screening mammogram for malignant neoplasm of breast: Secondary | ICD-10-CM

## 2019-12-20 ENCOUNTER — Ambulatory Visit: Payer: 59

## 2019-12-21 ENCOUNTER — Ambulatory Visit
Admission: RE | Admit: 2019-12-21 | Discharge: 2019-12-21 | Disposition: A | Discharge status: Home / Self Care | Payer: No Typology Code available for payment source | Source: Ambulatory Visit | Attending: Obstetrics | Admitting: Obstetrics

## 2019-12-21 ENCOUNTER — Other Ambulatory Visit: Payer: Self-pay

## 2019-12-21 ENCOUNTER — Ambulatory Visit: Payer: 59

## 2019-12-21 DIAGNOSIS — Z1231 Encounter for screening mammogram for malignant neoplasm of breast: Secondary | ICD-10-CM

## 2021-03-26 ENCOUNTER — Other Ambulatory Visit: Payer: Self-pay | Admitting: Obstetrics

## 2021-03-26 DIAGNOSIS — Z1231 Encounter for screening mammogram for malignant neoplasm of breast: Secondary | ICD-10-CM

## 2021-03-31 ENCOUNTER — Ambulatory Visit
Admission: RE | Admit: 2021-03-31 | Discharge: 2021-03-31 | Disposition: A | Discharge status: Home / Self Care | Payer: No Typology Code available for payment source | Source: Ambulatory Visit | Attending: Obstetrics | Admitting: Obstetrics

## 2021-03-31 ENCOUNTER — Other Ambulatory Visit: Payer: Self-pay

## 2021-03-31 DIAGNOSIS — Z1231 Encounter for screening mammogram for malignant neoplasm of breast: Secondary | ICD-10-CM

## 2021-04-13 ENCOUNTER — Emergency Department (HOSPITAL_BASED_OUTPATIENT_CLINIC_OR_DEPARTMENT_OTHER): Payer: No Typology Code available for payment source

## 2021-04-13 ENCOUNTER — Other Ambulatory Visit: Payer: Self-pay

## 2021-04-13 ENCOUNTER — Emergency Department (HOSPITAL_BASED_OUTPATIENT_CLINIC_OR_DEPARTMENT_OTHER)
Admission: EM | Admit: 2021-04-13 | Discharge: 2021-04-13 | Disposition: A | Discharge status: Home / Self Care | Payer: No Typology Code available for payment source | Attending: Emergency Medicine | Admitting: Emergency Medicine

## 2021-04-13 ENCOUNTER — Encounter (HOSPITAL_BASED_OUTPATIENT_CLINIC_OR_DEPARTMENT_OTHER): Payer: Self-pay | Admitting: Obstetrics and Gynecology

## 2021-04-13 DIAGNOSIS — R079 Chest pain, unspecified: Secondary | ICD-10-CM | POA: Diagnosis present

## 2021-04-13 DIAGNOSIS — K8020 Calculus of gallbladder without cholecystitis without obstruction: Secondary | ICD-10-CM | POA: Diagnosis not present

## 2021-04-13 DIAGNOSIS — R7401 Elevation of levels of liver transaminase levels: Secondary | ICD-10-CM | POA: Diagnosis not present

## 2021-04-13 DIAGNOSIS — R7989 Other specified abnormal findings of blood chemistry: Secondary | ICD-10-CM

## 2021-04-13 LAB — TROPONIN I (HIGH SENSITIVITY)
Troponin I (High Sensitivity): <2 ng/L (ref <18)
Troponin I (High Sensitivity): <2 ng/L (ref <18)

## 2021-04-13 LAB — LIPASE, BLOOD: Lipase: 51 U/L (ref 11–51)

## 2021-04-13 LAB — CBC
HCT: 42.4 % (ref 36.0–46.0)
Hemoglobin: 13.8 g/dL (ref 12.0–15.0)
MCH: 29.7 pg (ref 26.0–34.0)
MCHC: 32.5 g/dL (ref 30.0–36.0)
MCV: 91.4 fL (ref 80.0–100.0)
Platelets: 307 10*3/uL (ref 150–400)
RBC: 4.64 MIL/uL (ref 3.87–5.11)
RDW: 12.5 % (ref 11.5–15.5)
WBC: 6.9 10*3/uL (ref 4.0–10.5)
nRBC: 0 % (ref 0.0–0.2)

## 2021-04-13 LAB — COMPREHENSIVE METABOLIC PANEL
ALT: 51 U/L — ABNORMAL HIGH (ref 0–44)
AST: 50 U/L — ABNORMAL HIGH (ref 15–41)
Albumin: 4.1 g/dL (ref 3.5–5.0)
Alkaline Phosphatase: 57 U/L (ref 38–126)
Anion gap: 10 (ref 5–15)
BUN: 13 mg/dL (ref 6–20)
CO2: 25 mmol/L (ref 22–32)
Calcium: 9.4 mg/dL (ref 8.9–10.3)
Chloride: 106 mmol/L (ref 98–111)
Creatinine, Ser: 0.62 mg/dL (ref 0.44–1.00)
GFR, Estimated: >60 mL/min (ref >60)
Glucose, Bld: 93 mg/dL (ref 70–99)
Potassium: 4.1 mmol/L (ref 3.5–5.1)
Sodium: 141 mmol/L (ref 135–145)
Total Bilirubin: 0.5 mg/dL (ref 0.3–1.2)
Total Protein: 7.5 g/dL (ref 6.5–8.1)

## 2021-04-13 LAB — PREGNANCY, URINE: Preg Test, Ur: NEGATIVE

## 2021-04-13 LAB — D-DIMER, QUANTITATIVE: D-Dimer, Quant: 0.54 ug/mL-FEU — ABNORMAL HIGH (ref 0.00–0.50)

## 2021-04-13 MED ORDER — ONDANSETRON HCL 4 MG PO TABS: 4.0000 mg | ORAL_TABLET | Freq: Three times a day (TID) | ORAL | 0 refills | Status: DC | PRN

## 2021-04-13 MED ORDER — HYDROCODONE-ACETAMINOPHEN 5-325 MG PO TABS: 1.0000 | ORAL_TABLET | Freq: Four times a day (QID) | ORAL | 0 refills | Status: DC | PRN

## 2021-04-13 MED ORDER — ASPIRIN 81 MG PO CHEW
324.0000 mg | CHEWABLE_TABLET | Freq: Once | ORAL | Status: AC
  Administered 2021-04-13: 324 mg via ORAL
  Filled 2021-04-13: qty 4

## 2021-04-13 MED ORDER — IOHEXOL 350 MG/ML SOLN
100.0000 mL | Freq: Once | INTRAVENOUS | Status: AC | PRN
  Administered 2021-04-13: 75 mL via INTRAVENOUS

## 2021-04-13 NOTE — Discharge Instructions (Addendum)
You were seen in the emergency department for some right abdominal and flank pain.  You had a CAT scan of your chest along with blood work EKG.  Your ultrasound showed gallstones.  This is likely the source of your pain.  The surgery clinic should be calling you for follow-up appointment.  Return to the emergency department if the pain lasts more than a few hours or if you experience a fever.

## 2021-04-13 NOTE — ED Triage Notes (Signed)
Patient reports chest pain on the right side that started on Monday. Patient reports on Wednesday it felt better so she assumed it was MSK due to yard work. Yesterday she had increasing pain that started wrapping to her right arm and bra line region. Patient denies radiation. Patient denies N/V/D

## 2021-04-13 NOTE — ED Provider Notes (Signed)
[facility] EMERGENCY DEPT Provider Note   CSN: [id number] Arrival date & time: 04/13/21  0833     History Chief Complaint  Patient presents with  . Chest Pain    Patricia Thornton is a 50 y.o. female.  She is here with some right-sided chest pain that is been on and off for the last 5 days.  She initially attributed it to muscular pain from working in the yard.  Its been more consistent since yesterday and bothered her through the night.  Worse with moving.  Its in her shoulder right chest and wraps around to her right back.  Not associate with any shortness of breath nausea diaphoresis dizziness.  No prior history of cardiac disease.  The history is provided by the patient.  Chest Pain Pain location:  R chest Pain quality comment:  Cramping Pain radiates to:  Upper back and R shoulder Pain severity:  Moderate Onset quality:  Gradual Timing:  Intermittent Progression:  Unchanged Chronicity:  New Context: movement   Relieved by:  None tried Worsened by:  Certain positions and movement Ineffective treatments:  None tried Associated symptoms: no abdominal pain, no cough, no diaphoresis, no dizziness, no fever, no headache, no lower extremity edema, no nausea, no palpitations, no shortness of breath and no vomiting   Risk factors: no birth control, no coronary artery disease, no prior DVT/PE and no smoking        Past Medical History:  Diagnosis Date  . AMA (advanced maternal age) multigravida 52+   . Endometriosis   . PONV (postoperative nausea and vomiting)     Patient Active Problem List   Diagnosis Date Noted  . Impingement syndrome of left shoulder 02/17/2019  . Post-menopausal 09/30/2018  . Prediabetes 06/19/2017  . Physical exam, annual 06/18/2016  . Obesity 06/14/2015  . EUSTACHIAN TUBE DYSFUNCTION 04/06/2010  . Vitamin D deficiency 01/30/2010  . FAMILIAL HYPERCHOLESTEROLEMIA 01/30/2010    Past Surgical History:  Procedure Laterality Date   . APPENDECTOMY    . BREAST REDUCTION SURGERY    . LAPAROSCOPY    . REDUCTION MAMMAPLASTY    . TONSILLECTOMY       OB History    Gravida  6   Para  3   Term  3   Preterm  0   AB  3   Living  3     SAB  3   IAB  0   Ectopic  0   Multiple  0   Live Births  3           Family History  Problem Relation Age of Onset  . Diabetes Maternal Grandmother   . Cancer Maternal Grandmother   . Stroke Paternal Grandmother   . Cancer Paternal Grandfather   . Heart disease Mother        afib  . Other Neg Hx     Social History   Tobacco Use  . Smoking status: Never Smoker  . Smokeless tobacco: Never Used  Vaping Use  . Vaping Use: Never used  Substance Use Topics  . Alcohol use: No    Alcohol/week: 0.0 standard drinks  . Drug use: No    Home Medications Prior to Admission medications   Medication Sig Start Date End Date Taking? Authorizing Provider  aspirin-acetaminophen-caffeine (EXCEDRIN MIGRAINE) [phone] MG per tablet Take by mouth every 6 (six) hours as needed for headache.   Yes [provider]  Melatonin 5 MG CAPS Take by mouth.  Yes [provider]  Multiple Vitamins-Minerals (MULTIVITAMIN WITH MINERALS) tablet Take 1 tablet by mouth daily.   Yes [provider]  Semaglutide-Weight Management (WEGOVY) 2.4 MG/0.75ML SOAJ Inject 2.4 mg into the skin.   Yes [provider]  Vitamin D, Ergocalciferol, (DRISDOL) 50000 units CAPS capsule Take 1 capsule (50,000 Units total) by mouth every 7 (seven) days. 06/20/17  Yes Denney, Rachelle A, CNM  diazepam (VALIUM) 2 MG tablet Take one tablet 30 minutes prior to MRI, take the other tablet just before if needed. 06/30/19   Pete Pelt, PA-C  Green Tea, Camillia sinensis, 250 MG CAPS Take by mouth.    [provider]  phentermine 37.5 MG capsule Take 1 capsule (37.5 mg total) by mouth every morning. Patient not taking: No sig reported 09/30/18   Rasch, Anderson Malta I, NP   triamcinolone ointment (KENALOG) 0.5 % Apply 1 application topically 2 (two) times daily. Patient not taking: No sig reported 06/14/15   Kandis Cocking A, CNM    Allergies    Patient has no known allergies.  Review of Systems   Review of Systems  Constitutional: Negative for diaphoresis and fever.  HENT: Negative for sore throat.   Eyes: Negative for visual disturbance.  Respiratory: Negative for cough and shortness of breath.   Cardiovascular: Positive for chest pain. Negative for palpitations.  Gastrointestinal: Negative for abdominal pain, nausea and vomiting.  Genitourinary: Negative for dysuria.  Musculoskeletal: Negative for neck pain.  Skin: Negative for rash.  Neurological: Negative for dizziness and headaches.    Physical Exam Updated Vital Signs BP 130/63 (BP Location: Right Arm)   Pulse 83   Temp 98.1 F (36.7 C)   Resp (!) 24   Ht 5\' 5"  (1.651 m)   Wt 81.6 kg   LMP 05/03/2014   SpO2 100%   BMI 29.95 kg/m   Physical Exam Vitals and nursing note reviewed.  Constitutional:      General: She is not in acute distress.    Appearance: She is well-developed.  HENT:     Head: Normocephalic and atraumatic.  Eyes:     Conjunctiva/sclera: Conjunctivae normal.  Cardiovascular:     Rate and Rhythm: Normal rate and regular rhythm.     Heart sounds: Normal heart sounds. No murmur heard.   Pulmonary:     Effort: Pulmonary effort is normal. No respiratory distress.     Breath sounds: Normal breath sounds. No stridor. No wheezing.  Abdominal:     Palpations: Abdomen is soft.     Tenderness: There is no abdominal tenderness.  Musculoskeletal:        General: No tenderness. Normal range of motion.     Cervical back: Neck supple.     Right lower leg: No tenderness. No edema.     Left lower leg: No tenderness. No edema.  Skin:    General: Skin is warm and dry.     Capillary Refill: Capillary refill takes less than 2 seconds.  Neurological:     General: No focal  deficit present.     Mental Status: She is alert.     GCS: GCS eye subscore is 4. GCS verbal subscore is 5. GCS motor subscore is 6.     ED Results / Procedures / Treatments   Labs (all labs ordered are listed, but only abnormal results are displayed) Labs Reviewed  COMPREHENSIVE METABOLIC PANEL - Abnormal; Notable for the following components:      Result Value   AST  50 (*)    ALT 51 (*)    All other components within normal limits  D-DIMER, QUANTITATIVE - Abnormal; Notable for the following components:   D-Dimer, Quant 0.54 (*)    All other components within normal limits  CBC  PREGNANCY, URINE  LIPASE, BLOOD  TROPONIN I (HIGH SENSITIVITY)  TROPONIN I (HIGH SENSITIVITY)    EKG EKG Interpretation  Date/Time:  Friday Apr 13 2021 08:47:13 EDT Ventricular Rate:  84 PR Interval:  144 QRS Duration: 84 QT Interval:  354 QTC Calculation: 419 R Axis:   58 Text Interpretation: Sinus rhythm Low voltage, precordial leads RSR' in V1 or V2, probably normal variant No significant change since prior 6/11 Confirmed by Aletta Edouard [id number]) on 04/13/2021 8:54:19 AM   Radiology CT Angio Chest PE W/Cm &/Or Wo Cm  Result Date: 04/13/2021 CLINICAL DATA:  Chest pain on the right that started on Monday. EXAM: CT ANGIOGRAPHY CHEST WITH CONTRAST TECHNIQUE: Multidetector CT imaging of the chest was performed using the standard protocol during bolus administration of intravenous contrast. Multiplanar CT image reconstructions and MIPs were obtained to evaluate the vascular anatomy. CONTRAST:  41mL OMNIPAQUE IOHEXOL 350 MG/ML SOLN COMPARISON:  05/22/2010 FINDINGS: Cardiovascular: Satisfactory opacification of the pulmonary arteries to the segmental level. No evidence of pulmonary embolism. Normal heart size. No pericardial effusion. Mediastinum/Nodes: Negative for adenopathy or mass. Lungs/Pleura: There is no edema, consolidation, effusion, or pneumothorax. Upper Abdomen: Reference contemporaneous right  upper quadrant ultrasound. No abnormality by CT. Musculoskeletal: No acute finding or specific cause of symptoms. Review of the MIP images confirms the above findings. IMPRESSION: Negative chest CTA. Electronically Signed   By: Monte Fantasia M.D.   On: 04/13/2021 11:53   DG Chest Port 1 View  Result Date: 04/13/2021 CLINICAL DATA:  Chest pain EXAM: PORTABLE CHEST 1 VIEW COMPARISON:  May 21 2010 FINDINGS: Lungs are clear. Heart size and pulmonary vascularity are normal. No adenopathy. No pneumothorax. No bone lesions. IMPRESSION: Lungs clear.  Cardiac silhouette normal Electronically Signed   By: Lowella Grip III M.D.   On: 04/13/2021 09:16   US Abdomen Limited RUQ (LIVER/GB)  Result Date: 04/13/2021 CLINICAL DATA:  Right shoulder and breast pain for 5 days EXAM: ULTRASOUND ABDOMEN LIMITED RIGHT UPPER QUADRANT COMPARISON:  None. FINDINGS: Gallbladder: Multiple shadowing gallstones. No gallbladder wall thickening or focal tenderness. Stones are measured up to 11 mm. Common bile duct: Diameter: 4 mm.  Where visualized, no filling defect. Liver: No focal lesion identified. Within normal limits in parenchymal echogenicity. Portal vein is patent on color Doppler imaging with normal direction of blood flow towards the liver. IMPRESSION: Cholelithiasis without acute cholecystitis. Electronically Signed   By: Monte Fantasia M.D.   On: 04/13/2021 11:50    Procedures Procedures   Medications Ordered in ED Medications  aspirin chewable tablet 324 mg (324 mg Oral Given 04/13/21 0924)  iohexol (OMNIPAQUE) 350 MG/ML injection 100 mL (75 mLs Intravenous Contrast Given 04/13/21 1138)    ED Course  I have reviewed the triage vital signs and the nursing notes.  Pertinent labs & imaging results that were available during my care of the patient were reviewed by me and considered in my medical decision making (see chart for details).  Clinical Course as of 04/13/21 1742  Fri Apr 13, 2021  [id number] Chest x-ray  without any acute findings. [MB]  1209 Reviewed case with Dr. Donne Hazel general surgery.  He thought the patient could be discharged and follow-up in the office.  He  took her information via secure chat so he can have the office scheduler.  Patient is agreeable to plan.  She understands return instructions. [MB]    Clinical Course User Index [MB] Hayden Rasmussen, MD   MDM Rules/Calculators/A&P                         This patient complains of right-sided chest and flank pain; this involves an extensive number of treatment Options and is a complaint that carries with it a high risk of complications and Morbidity. The differential includes musculoskeletal, PE, pneumothorax, ACS, biliary colic, pyelonephritis  I ordered, reviewed and interpreted labs, which included CBC with normal white count normal hemoglobin, chemistries normal, LFTs with mild elevation in AST ALT, pregnancy test negative, D-dimer mildly elevated I ordered medication oral aspirin I ordered imaging studies which included CT PE and right upper quadrant ultrasound and I independently    visualized and interpreted imaging which showed no evidence of PE, multiple gallstones without evidence of cholecystitis Additional history obtained from patient's husband Previous records obtained and reviewed in epic, no recent admissions I consulted Dr. Donne Hazel general surgery and discussed lab and imaging findings  Critical Interventions: None  After the interventions stated above, I reevaluated the patient and found patient symptoms to be well controlled at the moment.  We discussed transfer for evaluation by surgery versus outpatient follow-up in the clinic.  She elects for outpatient follow-up in the clinic with clear return instructions given.  We will provide her with a small prescription for some nausea and pain medication.  Return instructions discussed   Final Clinical Impression(s) / ED Diagnoses Final diagnoses:  LFT  elevation  Calculus of gallbladder without cholecystitis without obstruction    Rx / DC Orders ED Discharge Orders         Ordered    ondansetron (ZOFRAN) 4 MG tablet  Every 8 hours PRN        04/13/21 1232    HYDROcodone-acetaminophen (NORCO/VICODIN) 5-325 MG tablet  Every 6 hours PRN        04/13/21 1232           Hayden Rasmussen, MD 04/13/21 1744

## 2021-04-13 NOTE — ED Notes (Signed)
Patient taken to imaging at this time.

## 2021-05-30 ENCOUNTER — Encounter: Payer: Self-pay | Admitting: Gastroenterology

## 2021-07-26 ENCOUNTER — Encounter: Payer: No Typology Code available for payment source | Admitting: Gastroenterology

## 2021-07-30 ENCOUNTER — Other Ambulatory Visit: Payer: Self-pay

## 2021-07-30 ENCOUNTER — Ambulatory Visit (AMBULATORY_SURGERY_CENTER): Payer: No Typology Code available for payment source | Admitting: *Deleted

## 2021-07-30 VITALS — Ht 65.0 in | Wt 171.0 lb

## 2021-07-30 DIAGNOSIS — Z1211 Encounter for screening for malignant neoplasm of colon: Secondary | ICD-10-CM

## 2021-07-30 NOTE — Progress Notes (Signed)
No egg or soy allergy known to patient  No issues with past sedation with any surgeries or procedures Patient denies ever being told they had issues or difficulty with intubation  No FH of Malignant Hyperthermia No diet pills per patient No home 02 use per patient  No blood thinners per patient  Pt denies issues with constipation  No A fib or A flutter  EMMI video to pt or via [place] 19 guidelines implemented in PV today with Pt and RN   Pt is fully vaccinated  for Covid   Brother is allergic to anesthesia that contain Sucx meds- but pt has no known allergies to this - brother hard to wake up post op- and cold after-   Due to the COVID-19 pandemic we are asking patients to follow certain guidelines.  Pt aware of COVID protocols and [facility] guidelines   Pt verified name, DOB, address and insurance during PV today.  Pt mailed instruction packet of Emmi video, copy of consent form to read and not return, and instructions.  PV completed over the phone.  Pt encouraged to call with questions or issues.  My Chart instructions to pt as well

## 2021-08-22 ENCOUNTER — Encounter: Payer: Self-pay | Admitting: Gastroenterology

## 2021-08-22 ENCOUNTER — Other Ambulatory Visit: Payer: Self-pay

## 2021-08-22 ENCOUNTER — Ambulatory Visit (AMBULATORY_SURGERY_CENTER): Payer: No Typology Code available for payment source | Admitting: Gastroenterology

## 2021-08-22 VITALS — BP 98/56 | HR 81 | Temp 97.5°F | Resp 16 | Ht 65.0 in | Wt 173.0 lb

## 2021-08-22 DIAGNOSIS — Z1211 Encounter for screening for malignant neoplasm of colon: Secondary | ICD-10-CM

## 2021-08-22 DIAGNOSIS — D125 Benign neoplasm of sigmoid colon: Secondary | ICD-10-CM

## 2021-08-22 DIAGNOSIS — Z800 Family history of malignant neoplasm of digestive organs: Secondary | ICD-10-CM

## 2021-08-22 MED ORDER — SODIUM CHLORIDE 0.9 % IV SOLN: 500.0000 mL | Freq: Once | INTRAVENOUS | Status: DC

## 2021-08-22 NOTE — Progress Notes (Signed)
Called to room to assist during endoscopic procedure.  Patient ID and intended procedure confirmed with present staff. Received instructions for my participation in the procedure from the performing physician.  

## 2021-08-22 NOTE — Progress Notes (Signed)
Referring Provider: Tonia Ghent, MD Primary Care Physician:  Ginger Organ., MD  Reason for Procedure:  Colon cancer screening   IMPRESSION:  Need for colon cancer screening  PLAN: [place]   HPI: Patricia Thornton is a 50 y.o. female presents for screening colonoscopy.  No prior colonoscopy or colon cancer screening.  No baseline GI symptoms.   Maternal grandmother with colon cancer in her 17s. Mother with colon polyps. No other known family history of colon cancer or polyps. No family history of uterine/endometrial cancer, pancreatic cancer or gastric/stomach cancer.   Past Medical History:  Diagnosis Date   Allergy    mild   AMA (advanced maternal age) multigravida 35+    Cancer ([place])    basal cell skin cancer   Endometriosis    PONV (postoperative nausea and vomiting)    also uncontrollable muscle spasms after anesthesia    Past Surgical History:  Procedure Laterality Date   APPENDECTOMY     BREAST REDUCTION SURGERY     EXCISION [facility]ORTON'S NEUROMA Left 2006   LAPAROSCOPY     x 2-- uncontrollable muscle spasms post op , PONV   REDUCTION MAMMAPLASTY     TONSILLECTOMY      Current Outpatient Medications  Medication Sig Dispense Refill   aspirin-acetaminophen-caffeine (EXCEDRIN MIGRAINE) 250-250-65 MG per tablet Take by mouth every 6 (six) hours as needed for headache.     Cholecalciferol 25 MCG (1000 UT) tablet Take by mouth.     Green Tea, Camillia sinensis, 250 MG CAPS Take by mouth. Contains caffeine     Melatonin 5 MG CAPS Take by mouth.     Multiple Vitamins-Minerals (MULTIVITAMIN WITH MINERALS) tablet Take 1 tablet by mouth daily.     hydroquinone 4 % cream Apply topically 2 (two) times daily.     Semaglutide-Weight Management (WEGOVY) 2.4 MG/0.75ML SOAJ Inject 2.4 mg into the skin.     triamcinolone ointment (KENALOG) 0.5 % Apply 1 application topically 2 (two) times daily. (Patient not taking: No sig reported) 30 g 2    Current Facility-Administered Medications  Medication Dose Route Frequency Provider Last Rate Last Admin   0.9 %  sodium chloride infusion  500 mL Intravenous Once [place], MD        Allergies as of 08/22/2021 - Review Complete 08/22/2021  Allergen Reaction Noted   Other Other (See Comments) 07/30/2021    Family History  Problem Relation Age of Onset   Colon polyps Mother    Heart disease Mother        afib   Colon cancer Maternal Grandmother    Diabetes Maternal Grandmother    Cancer Maternal Grandmother    Stroke Paternal Grandmother    Cancer Paternal Grandfather    Other Neg Hx    Esophageal cancer Neg Hx    Rectal cancer Neg Hx    Stomach cancer Neg Hx      Physical Exam: General:   Alert,  well-nourished, pleasant and cooperative in NAD Head:  Normocephalic and atraumatic. Eyes:  Sclera clear, no icterus.   Conjunctiva pink. Mouth:  No deformity or lesions.   Neck:  Supple; no masses or thyromegaly. Lungs:  Clear throughout to auscultation.   No wheezes. Heart:  Regular rate and rhythm; no murmurs. Abdomen:  Soft, non-tender, nondistended, normal bowel sounds, no rebound or guarding.  Msk:  Symmetrical. No boney deformities LAD: No inguinal or umbilical LAD Extremities:  No clubbing or edema. Neurologic:  Alert and  oriented x4;  grossly nonfocal Skin:  No obvious rash or bruise. Psych:  Alert and cooperative. Normal mood and affect.    Nathasha Fiorillo L. Tarri Glenn, MD, MPH 08/22/2021, 2:52 PM

## 2021-08-22 NOTE — Patient Instructions (Signed)
Thank you for allowing [place] to care for you today! Await biopsy result of polyp removed, approximately 7-10 days.  Will make recommendation for next colonoscopy, most likely 7-10 years. Resume previous diet and medications today, return to normal daily activities tomorrow.   YOU HAD AN ENDOSCOPIC PROCEDURE TODAY AT [facility]:   Refer to the procedure report that was given to you for any specific questions about what was found during the examination.  If the procedure report does not answer your questions, please call your gastroenterologist to clarify.  If you requested that your care partner not be given the details of your procedure findings, then the procedure report has been included in a sealed envelope for you to review at your convenience later.  YOU SHOULD EXPECT: Some feelings of bloating in the abdomen. Passage of more gas than usual.  Walking can help get rid of the air that was put into your GI tract during the procedure and reduce the bloating. If you had a lower endoscopy (such as a colonoscopy or flexible sigmoidoscopy) you may notice spotting of blood in your stool or on the toilet paper. If you underwent a bowel prep for your procedure, you may not have a normal bowel movement for a few days.  Please Note:  You might notice some irritation and congestion in your nose or some drainage.  This is from the oxygen used during your procedure.  There is no need for concern and it should clear up in a day or so.  SYMPTOMS TO REPORT IMMEDIATELY:  Following lower endoscopy (colonoscopy or flexible sigmoidoscopy):  Excessive amounts of blood in the stool  Significant tenderness or worsening of abdominal pains  Swelling of the abdomen that is new, acute  Fever of 100F or higher  For urgent or emergent issues, a gastroenterologist can be reached at any hour by calling [phone]. Do not use MyChart messaging for urgent concerns.    DIET:  We do recommend a small meal  at first, but then you may proceed to your regular diet.  Drink plenty of fluids but you should avoid alcoholic beverages for 24 hours.  ACTIVITY:  You should plan to take it easy for the rest of today and you should NOT DRIVE or use heavy machinery until tomorrow (because of the sedation medicines used during the test).    FOLLOW UP: Our staff will call the number listed on your records 48-72 hours following your procedure to check on you and address any questions or concerns that you may have regarding the information given to you following your procedure. If we do not reach you, we will leave a message.  We will attempt to reach you two times.  During this call, we will ask if you have developed any symptoms of COVID 19. If you develop any symptoms (ie: fever, flu-like symptoms, shortness of breath, cough etc.) before then, please call [phone].  If you test positive for Covid 19 in the 2 weeks post procedure, please call and report this information to [place].    If any biopsies were taken you will be contacted by phone or by letter within the next 1-3 weeks.  Please call us at [phone] if you have not heard about the biopsies in 3 weeks.    SIGNATURES/CONFIDENTIALITY: You and/or your care partner have signed paperwork which will be entered into your electronic medical record.  These signatures attest to the fact that that the information above on your After  Visit Summary has been reviewed and is understood.  Full responsibility of the confidentiality of this discharge information lies with you and/or your care-partner.

## 2021-08-22 NOTE — Progress Notes (Signed)
Report to PACU, RN, vss, BBS= Clear.  

## 2021-08-22 NOTE — Op Note (Signed)
[facility] Patient Name: Patricia Thornton Procedure Date: 08/22/2021 2:46 PM MRN: [id number] Endoscopist: Thornton Park MD, MD Age: 50 Referring MD:  Date of Birth: [birth year] Gender: Female Account #: [id number] Procedure:                Colonoscopy Indications:              Screening for colorectal malignant neoplasm, This                            is the patient's first colonoscopy                           Maternal grandmother with colon cancer in her 65s                           Mother with colon polyps Medicines:                Monitored Anesthesia Care Procedure:                Pre-Anesthesia Assessment:                           - Prior to the procedure, a History and Physical                            was performed, and patient medications and                            allergies were reviewed. The patient's tolerance of                            previous anesthesia was also reviewed. The risks                            and benefits of the procedure and the sedation                            options and risks were discussed with the patient.                            All questions were answered, and informed consent                            was obtained. Prior Anticoagulants: The patient has                            taken no previous anticoagulant or antiplatelet                            agents. ASA Grade Assessment: II - A patient with                            mild systemic disease. After reviewing the risks  and benefits, the patient was deemed in                            satisfactory condition to undergo the procedure.                           After obtaining informed consent, the colonoscope                            was passed under direct vision. Throughout the                            procedure, the patient's blood pressure, pulse, and                            oxygen saturations were monitored continuously. The                             Olympus CF-HQ190L (Serial# [id number]) Colonoscope was                            introduced through the anus and advanced to the 3                            cm into the ileum. A second forward view of the                            right colon was performed. The colonoscopy was                            performed without difficulty. The patient tolerated                            the procedure well. The quality of the bowel                            preparation was good. The terminal ileum, ileocecal                            valve, appendiceal orifice, and rectum were                            photographed. Scope In: 3:01:57 PM Scope Out: 3:17:15 PM Scope Withdrawal Time: 0 hours 10 minutes 52 seconds  Total Procedure Duration: 0 hours 15 minutes 18 seconds  Findings:                 The perianal and digital rectal examinations were                            normal.                           A 2 mm polyp was found in the sigmoid colon. The  polyp was sessile. The polyp was removed with a                            cold snare. Resection and retrieval were complete.                            Estimated blood loss was minimal.                           A few small and large-mouthed diverticula were                            found in the sigmoid colon.                           The exam was otherwise without abnormality on                            direct and retroflexion views except for small                            internal hemorrhoids. Complications:            No immediate complications. Estimated blood loss:                            Minimal. Estimated Blood Loss:     Estimated blood loss was minimal. Impression:               - One 2 mm polyp in the sigmoid colon, removed with                            a cold snare. Resected and retrieved.                           - The examination was otherwise normal on direct                             and retroflexion views. Recommendation:           - Patient has a contact number available for                            emergencies. The signs and symptoms of potential                            delayed complications were discussed with the                            patient. Return to normal activities tomorrow.                            Written discharge instructions were provided to the                            patient.                           -  Resume previous diet.                           - Continue present medications.                           - Await pathology results.                           - Repeat colonoscopy date to be determined after                            pending pathology results are reviewed for                            surveillance.                           - Emerging evidence supports eating a diet of                            fruits, vegetables, grains, calcium, and yogurt                            while reducing red meat and alcohol may reduce the                            risk of colon cancer.                           - Thank you for allowing me to be involved in your                            colon cancer prevention. Thornton Park MD, MD 08/22/2021 3:22:00 PM This report has been signed electronically.

## 2021-08-24 ENCOUNTER — Telehealth: Payer: Self-pay | Admitting: *Deleted

## 2021-08-24 NOTE — Telephone Encounter (Signed)
No answer for second post procedure call. Unable to leave message.

## 2021-08-24 NOTE — Telephone Encounter (Signed)
NO answer for post procedure call back. Left VM. 

## 2021-08-27 ENCOUNTER — Encounter: Payer: Self-pay | Admitting: Gastroenterology

## 2022-03-13 ENCOUNTER — Other Ambulatory Visit: Payer: Self-pay | Admitting: Obstetrics and Gynecology

## 2022-03-13 DIAGNOSIS — Z1231 Encounter for screening mammogram for malignant neoplasm of breast: Secondary | ICD-10-CM

## 2022-04-01 ENCOUNTER — Ambulatory Visit
Admission: RE | Admit: 2022-04-01 | Discharge: 2022-04-01 | Disposition: A | Discharge status: Home / Self Care | Payer: BC Managed Care – PPO | Source: Ambulatory Visit | Attending: Obstetrics and Gynecology | Admitting: Obstetrics and Gynecology

## 2022-04-01 DIAGNOSIS — Z1231 Encounter for screening mammogram for malignant neoplasm of breast: Secondary | ICD-10-CM

## 2022-08-19 DIAGNOSIS — D692 Other nonthrombocytopenic purpura: Secondary | ICD-10-CM | POA: Diagnosis not present

## 2022-08-19 DIAGNOSIS — L814 Other melanin hyperpigmentation: Secondary | ICD-10-CM | POA: Diagnosis not present

## 2022-08-19 DIAGNOSIS — Z85828 Personal history of other malignant neoplasm of skin: Secondary | ICD-10-CM | POA: Diagnosis not present

## 2022-08-19 DIAGNOSIS — L853 Xerosis cutis: Secondary | ICD-10-CM | POA: Diagnosis not present

## 2022-10-07 DIAGNOSIS — R5382 Chronic fatigue, unspecified: Secondary | ICD-10-CM | POA: Diagnosis not present

## 2022-10-07 DIAGNOSIS — R7989 Other specified abnormal findings of blood chemistry: Secondary | ICD-10-CM | POA: Diagnosis not present

## 2022-10-07 DIAGNOSIS — R7301 Impaired fasting glucose: Secondary | ICD-10-CM | POA: Diagnosis not present

## 2022-10-14 DIAGNOSIS — R82998 Other abnormal findings in urine: Secondary | ICD-10-CM | POA: Diagnosis not present

## 2022-10-14 DIAGNOSIS — R7301 Impaired fasting glucose: Secondary | ICD-10-CM | POA: Diagnosis not present

## 2022-10-14 DIAGNOSIS — Z1339 Encounter for screening examination for other mental health and behavioral disorders: Secondary | ICD-10-CM | POA: Diagnosis not present

## 2022-10-14 DIAGNOSIS — Z1331 Encounter for screening for depression: Secondary | ICD-10-CM | POA: Diagnosis not present

## 2022-10-14 DIAGNOSIS — Z0000 Encounter for general adult medical examination without abnormal findings: Secondary | ICD-10-CM | POA: Diagnosis not present

## 2023-02-10 DIAGNOSIS — Z1152 Encounter for screening for COVID-19: Secondary | ICD-10-CM | POA: Diagnosis not present

## 2023-02-10 DIAGNOSIS — J189 Pneumonia, unspecified organism: Secondary | ICD-10-CM | POA: Diagnosis not present

## 2023-02-10 DIAGNOSIS — R5383 Other fatigue: Secondary | ICD-10-CM | POA: Diagnosis not present

## 2023-02-10 DIAGNOSIS — R059 Cough, unspecified: Secondary | ICD-10-CM | POA: Diagnosis not present

## 2023-02-10 DIAGNOSIS — J209 Acute bronchitis, unspecified: Secondary | ICD-10-CM | POA: Diagnosis not present

## 2023-05-06 DIAGNOSIS — R5382 Chronic fatigue, unspecified: Secondary | ICD-10-CM | POA: Diagnosis not present

## 2023-05-06 DIAGNOSIS — R7301 Impaired fasting glucose: Secondary | ICD-10-CM | POA: Diagnosis not present

## 2023-05-06 DIAGNOSIS — E669 Obesity, unspecified: Secondary | ICD-10-CM | POA: Diagnosis not present

## 2023-07-03 ENCOUNTER — Other Ambulatory Visit: Payer: Self-pay | Admitting: Obstetrics and Gynecology

## 2023-07-03 DIAGNOSIS — Z1231 Encounter for screening mammogram for malignant neoplasm of breast: Secondary | ICD-10-CM

## 2023-07-17 ENCOUNTER — Ambulatory Visit
Admission: RE | Admit: 2023-07-17 | Discharge: 2023-07-17 | Disposition: A | Discharge status: Home / Self Care | Payer: BC Managed Care – PPO | Source: Ambulatory Visit | Attending: Obstetrics and Gynecology | Admitting: Obstetrics and Gynecology

## 2023-07-17 ENCOUNTER — Inpatient Hospital Stay: Admission: RE | Admit: 2023-07-17 | Payer: BC Managed Care – PPO | Source: Ambulatory Visit

## 2023-07-17 DIAGNOSIS — Z1231 Encounter for screening mammogram for malignant neoplasm of breast: Secondary | ICD-10-CM | POA: Diagnosis not present

## 2023-08-05 DIAGNOSIS — R7301 Impaired fasting glucose: Secondary | ICD-10-CM | POA: Diagnosis not present

## 2023-09-16 DIAGNOSIS — Z85828 Personal history of other malignant neoplasm of skin: Secondary | ICD-10-CM | POA: Diagnosis not present

## 2023-09-16 DIAGNOSIS — D225 Melanocytic nevi of trunk: Secondary | ICD-10-CM | POA: Diagnosis not present

## 2023-09-16 DIAGNOSIS — L814 Other melanin hyperpigmentation: Secondary | ICD-10-CM | POA: Diagnosis not present

## 2023-09-16 DIAGNOSIS — L738 Other specified follicular disorders: Secondary | ICD-10-CM | POA: Diagnosis not present

## 2023-09-24 DIAGNOSIS — L281 Prurigo nodularis: Secondary | ICD-10-CM | POA: Diagnosis not present

## 2023-10-21 DIAGNOSIS — R7301 Impaired fasting glucose: Secondary | ICD-10-CM | POA: Diagnosis not present

## 2023-10-21 DIAGNOSIS — Z23 Encounter for immunization: Secondary | ICD-10-CM | POA: Diagnosis not present

## 2024-09-03 ENCOUNTER — Other Ambulatory Visit: Payer: Self-pay | Admitting: Internal Medicine

## 2024-09-03 DIAGNOSIS — Z1231 Encounter for screening mammogram for malignant neoplasm of breast: Secondary | ICD-10-CM

## 2024-10-11 ENCOUNTER — Ambulatory Visit
Admission: RE | Admit: 2024-10-11 | Discharge: 2024-10-11 | Disposition: A | Discharge status: Home / Self Care | Source: Ambulatory Visit | Attending: Internal Medicine | Admitting: Internal Medicine

## 2024-10-11 DIAGNOSIS — Z1231 Encounter for screening mammogram for malignant neoplasm of breast: Secondary | ICD-10-CM
